# Patient Record
Sex: Female | Born: 1945 | Race: White | Hispanic: No | Marital: Married | Smoking: Never smoker
Health system: Southern US, Community
[De-identification: ages and names within clinical notes are randomized; demographics above are authoritative.]

## PROBLEM LIST (undated history)

## (undated) DIAGNOSIS — E785 Hyperlipidemia, unspecified: Secondary | ICD-10-CM

## (undated) DIAGNOSIS — E039 Hypothyroidism, unspecified: Secondary | ICD-10-CM

## (undated) DIAGNOSIS — I10 Essential (primary) hypertension: Secondary | ICD-10-CM

## (undated) DIAGNOSIS — M1990 Unspecified osteoarthritis, unspecified site: Secondary | ICD-10-CM

## (undated) DIAGNOSIS — N390 Urinary tract infection, site not specified: Secondary | ICD-10-CM

## (undated) DIAGNOSIS — L309 Dermatitis, unspecified: Secondary | ICD-10-CM

## (undated) HISTORY — PX: TUBAL LIGATION: SHX77

## (undated) HISTORY — DX: Dermatitis, unspecified: L30.9

## (undated) HISTORY — DX: Essential (primary) hypertension: I10

## (undated) HISTORY — DX: Hypothyroidism, unspecified: E03.9

## (undated) HISTORY — DX: Hyperlipidemia, unspecified: E78.5

## (undated) HISTORY — PX: FRACTURE SURGERY: SHX138

## (undated) HISTORY — PX: TONSILLECTOMY: SUR1361

---

## 2009-08-29 ENCOUNTER — Ambulatory Visit: Payer: Self-pay | Admitting: Cardiovascular Disease

## 2009-09-12 ENCOUNTER — Ambulatory Visit: Payer: Self-pay | Admitting: Cardiovascular Disease

## 2009-09-27 HISTORY — PX: FINGER GANGLION CYST EXCISION: SHX1636

## 2010-07-22 ENCOUNTER — Ambulatory Visit (HOSPITAL_BASED_OUTPATIENT_CLINIC_OR_DEPARTMENT_OTHER): Admission: RE | Admit: 2010-07-22 | Discharge: 2010-07-22 | Payer: Self-pay | Admitting: Plastic Surgery

## 2010-08-19 ENCOUNTER — Telehealth (INDEPENDENT_AMBULATORY_CARE_PROVIDER_SITE_OTHER): Payer: Self-pay | Admitting: *Deleted

## 2010-09-27 HISTORY — PX: UTERINE FIBROID SURGERY: SHX826

## 2010-10-27 NOTE — Progress Notes (Signed)
  Phone Note Other Incoming   Request: Send information Summary of Call: Request received from [employer] forwarded to [employer].       

## 2010-12-09 LAB — POCT I-STAT, CHEM 8
Calcium, Ion: 0.86 mmol/L — ABNORMAL LOW (ref 1.12–1.32)
Chloride: 112 mEq/L (ref 96–112)
Creatinine, Ser: 1.1 mg/dL (ref 0.4–1.2)
Glucose, Bld: 90 mg/dL (ref 70–99)
HCT: 45 % (ref 36.0–46.0)

## 2011-02-09 NOTE — Letter (Signed)
August 29, 2009    Dr. Lucila Maine  [address]  [place], [place]  [address]   RE:  [name], [name]  MRN:  [id number]  /  DOB:  02/08/1946   Dear Dr. Lorin Picket:   I had the pleasure of seeing your patient, Julia Hess in the  Cardiology Clinic today.  As you know, she is a 65 year old white female  with hypertension and hyperlipidemia who was found to have Q-waves in  the septal leads on her EKG.  The patient has excellent functional  status and is able to perform aerobic exercise for 30-60 minutes several  times a week without any difficulty whatsoever.  This is very  encouraging.  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  We will proceed with a  transthoracic echocardiogram to evaluate the patient's left ventricular  systolic function.  If the patient's ejection fraction is indeed  decreased, we will proceed with left heart catheterization.  Otherwise,  we will perform a stress test.  I hope you are in agreement with this  approach.   Thank for the referral of this patient and please feel free to contact  my office if I can be of further assistance.    Sincerely,      [name], MD  Electronically Signed    SGA/MedQ  DD: 08/29/2009  DT: 08/29/2009  Job #: [id number]

## 2011-02-09 NOTE — Assessment & Plan Note (Signed)
[facility]                        [place] CARDIOLOGY OFFICE NOTE   Julia Hess, Julia Hess                        MRN:          [id number]  DATE:09/12/2009                            DOB:          08-Oct-1945    PROBLEM LIST:  1. Hyperlipidemia.  2. Hypothyroidism.  3. EKG indicating a possible old anterior septal myocardial      infarction.   INTERVAL HISTORY:  The patient states she has been doing well as to her  last visit.  She continues to exercise 2-3 times a week for at least 30  minutes each time without any difficulty, whatsoever.  Specifically, she  denies any chest discomfort, shortness of breath or dyspnea on exertion.  The patient underwent a transthoracic echocardiogram which showed  completely normal left ventricular systolic function and no significant  valvular regurgitation.  She has subsequently underwent an exercise  Cardiolite, on which she performed 11 METS activity without any chest  discomfort.  There are no perfusion defects seen on the imaging scan.   PHYSICAL EXAMINATION:  VITAL SIGNS:  Today, her blood pressure is  127/78, pulse is 60, sating 98% on room air.  She weighs 130 pounds.  GENERAL:  She is in no acute distress.  HEENT:  Normocephalic, atraumatic.  NECK:  Supple.  There are no carotid bruits.  HEART:  Regular rate and rhythm without murmur, rub or gallop.  LUNGS:  Clear bilaterally.  ABDOMEN:  Soft, nontender, nondistended.  EXTREMITIES:  Without edema.  SKIN:  Warm and dry.   EKG from today independently interpreted by myself demonstrates normal  sinus rhythm.  She does have delayed R-wave progression and there are  possible Q-waves in leads V1 through V3.  However, upon very close  inspection it appears that there may be a slight R-waves in these leads.   Review of the patient's echocardiogram stress test as above in the  interval history.  Review of the patient's labs show a CBC within normal  limits, a  CMP within normal limits.  Her total cholesterol was 205, HDL  71, triglycerides 153, and her LDL is 103.  Her troponin was less than  0.31 and her vitamin D level was 56.  These labs are from December 6.   ASSESSMENT AND PLAN:  A 65 year old white female with hyperlipidemia  that is well treated, who is not having any signs of angina, heart  failure or arrhythmia.  Her cardiovascular workup for obstructive  coronary artery disease has been completely within normal limits.  At  this point, I think unlikely that the patient has had a myocardial  infarction.  I think that there are small R-waves present on the EKG and  that the findings in the anterior septal leads probably do not represent  an old myocardial infarction.  I recommend that she continue on a baby  aspirin and statin therapy.  She is currently also on a beta-blocker and  she will follow up with her primary care physician regarding appropriate  antihypertensive therapy.  We recommend that she increase her physical  activity to 5 or 6 days  a week, 30-60 minutes each day.  She will  contact our office in the future on an as-needed basis.  Otherwise, she  will follow up with Dr. Lorin Picket.      [name], MD  Electronically Signed    SGA/MedQ  DD: 09/12/2009  DT: 09/13/2009  Job #: [id number]   cc:   Mervin Kung

## 2011-02-09 NOTE — Assessment & Plan Note (Signed)
[facility]                        [place] CARDIOLOGY OFFICE NOTE   Julia Hess, Julia Hess                        MRN:          [id number]  DATE:08/29/2009                            DOB:          14-Mar-1946    CHIEF COMPLAINT:  Abnormal EKG.   HISTORY OF PRESENT ILLNESS:  Julia Hess is a 65 year old white female,  past medical history significant for hypertension, hyperlipidemia,  hypothyroidism, who is presenting after her primary care physician found  Q-waves in the septal leads on her EKG.  The patient states she is very  active in her daily life.  She performs aerobic exercise approximately 3  times a week and lasting 30-60 minutes without any difficulty  whatsoever.  Specifically, she denies any chest discomfort, shortness of  breath that is above what one would expect with aerobic exercise.  She  also denies any dizziness, syncope, lower extremity edema, PND,  orthopnea.  The patient does state that approximately 6 weeks ago, she  had 1 episode of a squeezing like feeling in her chest that lasted  approximately 20 minutes and did not have any associated symptoms.  The  chest pain resolved on its own and has not reoccurred since then.  She  saw her primary care physician last week and noticed Q-waves in leads V1  and V2 and a referral to Cardiology was made.   PAST MEDICAL HISTORY:  1. Hypertension.  2. Hypothyroidism.  3. Hyperlipidemia.   SOCIAL HISTORY:  No tobacco, no alcohol.   FAMILY HISTORY:  Negative for premature coronary artery disease,  although her father did have an MI in his late 87s.   ALLERGIES:  COMPAZINE.   MEDICATIONS:  1. Aspirin 162 mg daily.  2. Plavix 75 mg daily (This was started 2 days ago by her PCP).  3. Crestor 10 mg daily.  4. Metoprolol succinate 50 mg daily (This was started 2 days ago by      PCP).  5. Synthroid 50 mcg daily.  6. Estrace p.r.n.   REVIEW OF SYSTEMS:  As in HPI.  All other systems were  reviewed and are  negative.   PHYSICAL EXAMINATION:  VITAL SIGNS:  The patient's blood pressure is  135/76, pulse 65, sating 98% on room air, and she weighs 131 pounds.  GENERAL:  No acute distress.  HEENT:  Normocephalic, atraumatic, nonfocal.  NECK:  Supple.  There is no JVD.  There are no carotid bruits.  HEART:  Regular rate and rhythm without murmur, rub, or gallop.  LUNGS:  Clear bilaterally.  ABDOMEN:  Soft, nontender, nondistended.  EXTREMITIES:  Without edema.  SKIN:  Warm and dry.  NEUROLOGIC:  Nonfocal.  PSYCHIATRIC:  The patient is mildly anxious, but appropriate with normal  levels of insight.  MUSCULOSKELETAL:  The patient has 5/5 bilateral upper and lower  extremity strength.  Pulses, the patient has 2+ bilateral carotid,  radial, and as she has palpable dorsalis pedis pulses bilaterally.   EKG independently reviewed by myself demonstrates a sinus bradycardia  with ventricular rate of 56 beats per minute.  There are Q-waves in  leads V1 through V3 with approximately 1.5-mm ST-segment elevation.  It  is stable when compared with EKG from primary care physician's office.  There is now also Q-wave in lead V3 which is likely the results of lead  placement.   ASSESSMENT:  A 65 year old white female with hypertension and  hyperlipidemia presenting with an abnormal EKG indicating she likely has  had a prior myocardial infarction.  The patient has excellent functional  status and is able to perform aerobic exercise for significant amount of  time without any difficulty whatsoever.  She is not having any symptoms  consistent with angina, heart failure, or arrhythmia.   PLAN:  We will initially check a transthoracic echocardiogram to  evaluate the patient's left ventricular systolic function.  If the  patient's ventricular systolic function is within normal limits, we will  proceed with a stress test to evaluate for any inducible ischemia.  If  the patient's ejection  fraction is decreased, we will proceed with a  left heart catheterization.  We will also check a CMP, CBC, vitamin D  level, and fasting lipid profile.  The patient is instructed that if she  were to develop any chest discomfort that does not quickly resolve, she  should call 911.  We will contact the patient once results for  echocardiogram are obtained early next week.     Julia El, MD  Electronically Signed    SGA/MedQ  DD: 08/29/2009  DT: 08/30/2009  Job #: [id number]

## 2011-05-19 ENCOUNTER — Encounter: Payer: Self-pay | Admitting: Cardiovascular Disease

## 2011-06-04 ENCOUNTER — Encounter: Payer: Self-pay | Admitting: Cardiovascular Disease

## 2012-07-07 ENCOUNTER — Encounter (HOSPITAL_BASED_OUTPATIENT_CLINIC_OR_DEPARTMENT_OTHER): Payer: Self-pay | Admitting: *Deleted

## 2012-07-07 ENCOUNTER — Other Ambulatory Visit: Payer: Self-pay | Admitting: Orthopedic Surgery

## 2012-07-07 NOTE — Progress Notes (Signed)
To come in for ekg-bmet 

## 2012-07-10 ENCOUNTER — Encounter (HOSPITAL_BASED_OUTPATIENT_CLINIC_OR_DEPARTMENT_OTHER)
Admission: RE | Admit: 2012-07-10 | Discharge: 2012-07-10 | Disposition: A | Discharge status: Home / Self Care | Payer: Medicare Other | Source: Ambulatory Visit | Attending: Orthopedic Surgery | Admitting: Orthopedic Surgery

## 2012-07-10 LAB — BASIC METABOLIC PANEL
BUN: 10 mg/dL (ref 6–23)
CO2: 28 mEq/L (ref 19–32)
Calcium: 9.2 mg/dL (ref 8.4–10.5)
Creatinine, Ser: 0.9 mg/dL (ref 0.50–1.10)
Glucose, Bld: 85 mg/dL (ref 70–99)

## 2012-07-12 ENCOUNTER — Ambulatory Visit (HOSPITAL_BASED_OUTPATIENT_CLINIC_OR_DEPARTMENT_OTHER)
Admission: RE | Admit: 2012-07-12 | Discharge: 2012-07-12 | Disposition: A | Discharge status: Home / Self Care | Payer: Medicare Other | Source: Ambulatory Visit | Attending: Orthopedic Surgery | Admitting: Orthopedic Surgery

## 2012-07-12 ENCOUNTER — Encounter (HOSPITAL_BASED_OUTPATIENT_CLINIC_OR_DEPARTMENT_OTHER): Payer: Self-pay | Admitting: *Deleted

## 2012-07-12 ENCOUNTER — Encounter (HOSPITAL_BASED_OUTPATIENT_CLINIC_OR_DEPARTMENT_OTHER)
Admission: RE | Disposition: A | Discharge status: Home / Self Care | Payer: Self-pay | Source: Ambulatory Visit | Attending: Orthopedic Surgery

## 2012-07-12 ENCOUNTER — Ambulatory Visit (HOSPITAL_BASED_OUTPATIENT_CLINIC_OR_DEPARTMENT_OTHER): Payer: Medicare Other | Admitting: Certified Registered Nurse Anesthetist

## 2012-07-12 ENCOUNTER — Encounter (HOSPITAL_BASED_OUTPATIENT_CLINIC_OR_DEPARTMENT_OTHER): Payer: Self-pay | Admitting: Certified Registered Nurse Anesthetist

## 2012-07-12 ENCOUNTER — Encounter (HOSPITAL_BASED_OUTPATIENT_CLINIC_OR_DEPARTMENT_OTHER): Payer: Self-pay | Admitting: Orthopedic Surgery

## 2012-07-12 DIAGNOSIS — M6740 Ganglion, unspecified site: Secondary | ICD-10-CM | POA: Insufficient documentation

## 2012-07-12 DIAGNOSIS — I10 Essential (primary) hypertension: Secondary | ICD-10-CM | POA: Insufficient documentation

## 2012-07-12 DIAGNOSIS — Z01812 Encounter for preprocedural laboratory examination: Secondary | ICD-10-CM | POA: Insufficient documentation

## 2012-07-12 DIAGNOSIS — Z01810 Encounter for preprocedural cardiovascular examination: Secondary | ICD-10-CM | POA: Insufficient documentation

## 2012-07-12 HISTORY — DX: Urinary tract infection, site not specified: N39.0

## 2012-07-12 HISTORY — DX: Unspecified osteoarthritis, unspecified site: M19.90

## 2012-07-12 HISTORY — PX: MASS EXCISION: SHX2000

## 2012-07-12 SURGERY — EXCISION MASS
Anesthesia: General | Laterality: Right

## 2012-07-12 MED ORDER — CEFAZOLIN SODIUM-DEXTROSE 2-3 GM-% IV SOLR
2.0000 g | INTRAVENOUS | Status: AC
  Administered 2012-07-12: 2 g via INTRAVENOUS

## 2012-07-12 MED ORDER — LIDOCAINE HCL (CARDIAC) 20 MG/ML IV SOLN
INTRAVENOUS | Status: DC | PRN
  Administered 2012-07-12: 50 mg via INTRAVENOUS

## 2012-07-12 MED ORDER — FENTANYL CITRATE 0.05 MG/ML IJ SOLN
INTRAMUSCULAR | Status: DC | PRN
  Administered 2012-07-12: 50 ug via INTRAVENOUS

## 2012-07-12 MED ORDER — HYDROCODONE-ACETAMINOPHEN 5-500 MG PO TABS: 1.0000 | ORAL_TABLET | ORAL | Status: AC | PRN

## 2012-07-12 MED ORDER — CHLORHEXIDINE GLUCONATE 4 % EX LIQD: 60.0000 mL | Freq: Once | CUTANEOUS | Status: DC

## 2012-07-12 MED ORDER — OXYCODONE HCL 5 MG/5ML PO SOLN: 5.0000 mg | Freq: Once | ORAL | Status: DC | PRN

## 2012-07-12 MED ORDER — HYDROMORPHONE HCL PF 1 MG/ML IJ SOLN: 0.2500 mg | INTRAMUSCULAR | Status: DC | PRN

## 2012-07-12 MED ORDER — ONDANSETRON HCL 4 MG/2ML IJ SOLN: 4.0000 mg | Freq: Once | INTRAMUSCULAR | Status: DC | PRN

## 2012-07-12 MED ORDER — LACTATED RINGERS IV SOLN
INTRAVENOUS | Status: DC
  Administered 2012-07-12: 10:00:00 via INTRAVENOUS

## 2012-07-12 MED ORDER — BUPIVACAINE HCL (PF) 0.25 % IJ SOLN
INTRAMUSCULAR | Status: DC | PRN
  Administered 2012-07-12: 10 mL

## 2012-07-12 MED ORDER — PROPOFOL 10 MG/ML IV BOLUS
INTRAVENOUS | Status: DC | PRN
  Administered 2012-07-12: 150 mg via INTRAVENOUS

## 2012-07-12 MED ORDER — MIDAZOLAM HCL 5 MG/5ML IJ SOLN
INTRAMUSCULAR | Status: DC | PRN
  Administered 2012-07-12: 2 mg via INTRAVENOUS

## 2012-07-12 MED ORDER — DEXAMETHASONE SODIUM PHOSPHATE 10 MG/ML IJ SOLN
INTRAMUSCULAR | Status: DC | PRN
  Administered 2012-07-12: 10 mg via INTRAVENOUS

## 2012-07-12 MED ORDER — ONDANSETRON HCL 4 MG/2ML IJ SOLN
INTRAMUSCULAR | Status: DC | PRN
  Administered 2012-07-12: 4 mg via INTRAVENOUS

## 2012-07-12 MED ORDER — OXYCODONE HCL 5 MG PO TABS: 5.0000 mg | ORAL_TABLET | Freq: Once | ORAL | Status: DC | PRN

## 2012-07-12 SURGICAL SUPPLY — 52 items
BANDAGE COBAN STERILE 2 (GAUZE/BANDAGES/DRESSINGS) IMPLANT
BANDAGE GAUZE ELAST BULKY 4 IN (GAUZE/BANDAGES/DRESSINGS) IMPLANT
BLADE MINI RND TIP GREEN BEAV (BLADE) ×2 IMPLANT
BLADE SURG 15 STRL LF DISP TIS (BLADE) ×1 IMPLANT
BLADE SURG 15 STRL SS (BLADE) ×1
BNDG COHESIVE 1X5 TAN STRL LF (GAUZE/BANDAGES/DRESSINGS) ×2 IMPLANT
BNDG COHESIVE 3X5 TAN STRL LF (GAUZE/BANDAGES/DRESSINGS) IMPLANT
BNDG ESMARK 4X9 LF (GAUZE/BANDAGES/DRESSINGS) ×2 IMPLANT
CHLORAPREP W/TINT 26ML (MISCELLANEOUS) ×2 IMPLANT
CLOTH BEACON ORANGE TIMEOUT ST (SAFETY) ×2 IMPLANT
CORDS BIPOLAR (ELECTRODE) ×2 IMPLANT
COVER MAYO STAND STRL (DRAPES) ×2 IMPLANT
COVER TABLE BACK 60X90 (DRAPES) ×2 IMPLANT
CUFF TOURNIQUET SINGLE 18IN (TOURNIQUET CUFF) IMPLANT
DECANTER SPIKE VIAL GLASS SM (MISCELLANEOUS) IMPLANT
DRAIN PENROSE 1/2X12 LTX STRL (WOUND CARE) IMPLANT
DRAPE EXTREMITY T 121X128X90 (DRAPE) ×2 IMPLANT
DRAPE SURG 17X23 STRL (DRAPES) ×2 IMPLANT
GAUZE XEROFORM 1X8 LF (GAUZE/BANDAGES/DRESSINGS) ×2 IMPLANT
GLOVE BIO SURGEON STRL SZ 6.5 (GLOVE) ×2 IMPLANT
GLOVE BIOGEL PI IND STRL 7.0 (GLOVE) ×1 IMPLANT
GLOVE BIOGEL PI IND STRL 8.5 (GLOVE) ×1 IMPLANT
GLOVE BIOGEL PI INDICATOR 7.0 (GLOVE) ×1
GLOVE BIOGEL PI INDICATOR 8.5 (GLOVE) ×1
GLOVE SURG ORTHO 8.0 STRL STRW (GLOVE) ×2 IMPLANT
GOWN BRE IMP PREV XXLGXLNG (GOWN DISPOSABLE) ×2 IMPLANT
GOWN PREVENTION PLUS XLARGE (GOWN DISPOSABLE) ×2 IMPLANT
NDL SAFETY ECLIPSE 18X1.5 (NEEDLE) IMPLANT
NEEDLE 27GAX1X1/2 (NEEDLE) ×2 IMPLANT
NEEDLE HYPO 18GX1.5 SHARP (NEEDLE)
NS IRRIG 1000ML POUR BTL (IV SOLUTION) ×2 IMPLANT
PACK BASIN DAY SURGERY FS (CUSTOM PROCEDURE TRAY) ×2 IMPLANT
PAD CAST 3X4 CTTN HI CHSV (CAST SUPPLIES) IMPLANT
PADDING CAST ABS 3INX4YD NS (CAST SUPPLIES)
PADDING CAST ABS 4INX4YD NS (CAST SUPPLIES)
PADDING CAST ABS COTTON 3X4 (CAST SUPPLIES) IMPLANT
PADDING CAST ABS COTTON 4X4 ST (CAST SUPPLIES) IMPLANT
PADDING CAST COTTON 3X4 STRL (CAST SUPPLIES)
SPLINT FINGER 5/8X3.25 (SOFTGOODS) ×2 IMPLANT
SPLINT FINGER FOAM 3 9119 05 (SOFTGOODS) ×4
SPLINT PLASTER CAST XFAST 3X15 (CAST SUPPLIES) IMPLANT
SPLINT PLASTER XTRA FASTSET 3X (CAST SUPPLIES)
SPONGE GAUZE 4X4 12PLY (GAUZE/BANDAGES/DRESSINGS) ×2 IMPLANT
STOCKINETTE 4X48 STRL (DRAPES) ×2 IMPLANT
SUT VIC AB 4-0 P2 18 (SUTURE) IMPLANT
SUT VICRYL RAPID 5 0 P 3 (SUTURE) IMPLANT
SUT VICRYL RAPIDE 4/0 PS 2 (SUTURE) ×2 IMPLANT
SYR BULB 3OZ (MISCELLANEOUS) ×2 IMPLANT
SYR CONTROL 10ML LL (SYRINGE) ×2 IMPLANT
TOWEL OR 17X24 6PK STRL BLUE (TOWEL DISPOSABLE) ×2 IMPLANT
UNDERPAD 30X30 INCONTINENT (UNDERPADS AND DIAPERS) ×2 IMPLANT
WATER STERILE IRR 1000ML POUR (IV SOLUTION) IMPLANT

## 2012-07-12 NOTE — Anesthesia Preprocedure Evaluation (Signed)
Anesthesia Evaluation  Patient identified by MRN, date of birth, ID band Patient awake    Reviewed: Allergy & Precautions, H&P , NPO status , Patient's Chart, lab work & pertinent test results  Airway Mallampati: I TM Distance: >3 FB Neck ROM: Full    Dental  (+) Teeth Intact   Pulmonary  breath sounds clear to auscultation        Cardiovascular hypertension, Pt. on medications Rhythm:Regular Rate:Normal     Neuro/Psych    GI/Hepatic   Endo/Other    Renal/GU      Musculoskeletal   Abdominal   Peds  Hematology   Anesthesia Other Findings   Reproductive/Obstetrics                           Anesthesia Physical Anesthesia Plan  ASA: III  Anesthesia Plan: General   Post-op Pain Management:    Induction: Intravenous  Airway Management Planned: LMA  Additional Equipment:   Intra-op Plan:   Post-operative Plan: Extubation in OR  Informed Consent: I have reviewed the patients History and Physical, chart, labs and discussed the procedure including the risks, benefits and alternatives for the proposed anesthesia with the patient or authorized representative who has indicated his/her understanding and acceptance.   Dental advisory given  Plan Discussed with: Anesthesiologist, CRNA and Surgeon  Anesthesia Plan Comments:         Anesthesia Quick Evaluation

## 2012-07-12 NOTE — H&P (Signed)
Julia Hess is a 66 year old right hand dominant female who comes in complaining of a mass on the dorsal aspect of her right index finger for the past 6 months to a year. She recalls no history of injury to it. It is not particularly painful for her. She states that 2 weeks ago it opened up and drained. She has had a mucoid cyst removed from her left hand in the past. She has no history of diabetes. She a history of thyroid problems, arthritis and gout. Her cyst was removed from her left middle finger and has not returned.  PAST MEDICAL HISTORY: She is on the following medications: Calcium, cranberry, Crestor, Desonide, Estrace, Losartan potassium, Nitrofurantoin, Synthroid, and vitamin D. She is allergic to Compazine. She has had T&A, tubal ligation, broken ankle, cyst removed from her finger in 2011, and thyroidectomy.  FAMILY H ISTORY: Positive for high BP, heart disease and arthritis.  SOCIAL HISTORY: She does not smoke or drink. She is married and a Veterinary surgeon.  REVIEW OF SYSTEMS: Positive cataracts, ringing in her ears, high BP, heart attack, easy bleeding, otherwise normal. Julia Hess is an 66 y.o. female.   Chief Complaint: mucoid tumors RIF, RRF HPI: see above  Past Medical History  Diagnosis Date  . HTN (hypertension)   . HLD (hyperlipidemia)   . Hypothyroidism   . Frequent UTI   . Arthritis     Past Surgical History  Procedure Date  . Uterine fibroid surgery 2012  . Tonsillectomy   . Tubal ligation   . Finger ganglion cyst excision 2011  . Fracture surgery     lt ankle    Family History  Problem Relation Age of Onset  . Heart attack     Social History:  reports that she has never smoked. She does not have any smokeless tobacco history on file. She reports that she drinks alcohol. She reports that she does not use illicit drugs.  Allergies:  Allergies  Allergen Reactions  . Compazine     Medications Prior to Admission  Medication Sig Dispense Refill  .  aspirin 81 MG tablet Take 81 mg by mouth daily.        Marland Kitchen estradiol (ESTRACE) 0.1 MG/GM vaginal cream Place 2 g vaginally as needed.        Marland Kitchen levothyroxine (SYNTHROID, LEVOTHROID) 50 MCG tablet Take 50 mcg by mouth daily.        Marland Kitchen losartan (COZAAR) 50 MG tablet Take 50 mg by mouth daily.      . nitrofurantoin, macrocrystal-monohydrate, (MACROBID) 100 MG capsule Take 100 mg by mouth 2 (two) times daily. As needed for UTI       . rosuvastatin (CRESTOR) 10 MG tablet Take 10 mg by mouth daily.          No results found for this or any previous visit (from the past 48 hour(s)).  No results found.   Pertinent items are noted in HPI.  Blood pressure 120/79, pulse 73, temperature 98.2 F (36.8 C), temperature source Oral, resp. rate 18, height 5\' 2"  (1.575 m), weight 57.38 kg (126 lb 8 oz), SpO2 100.00%.  General appearance: alert, cooperative and appears stated age Head: Normocephalic, without obvious abnormality Neck: no adenopathy Resp: clear to auscultation bilaterally Cardio: regular rate and rhythm, S1, S2 normal, no murmur, click, rub or gallop GI: soft, non-tender; bowel sounds normal; no masses,  no organomegaly Extremities: extremities normal, atraumatic, no cyanosis or edema Pulses: 2+ and symmetric Skin: Skin color, texture,  turgor normal. No rashes or lesions Neurologic: Grossly normal Incision/Wound: na  Assessment/Plan X-rays of the index finger reveals degenerative arthritis.  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  We would recommend surgical excision with debridement of the joint. This can be done at the same time. The pre, peri and post op course are discussed along with risks and complications.  She is aware there is no guarantee with surgery, possibility of infection, recurrence, injury to arteries, nerves and tendons, incomplete relief of symptoms, recurrence of symptoms, debridement of joint, possibility of fusion to be  certain that another one does not occur and dystrophy. We would not recommend at this time.   Julia Hess R 07/12/2012, 11:22 AM

## 2012-07-12 NOTE — Brief Op Note (Signed)
07/12/2012  12:28 PM  PATIENT:  Julia Hess  66 y.o. 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  POST-OPERATIVE DIAGNOSIS:  Mucoid Cyst Right Index Finger and Right Ring Finger  PROCEDURE:  Procedure(s) (LRB) with comments: EXCISION MASS (Right) - Excision Cyst Right Index and Right Ring Finger and Debridement DIP Right Index Finger and Right Ring Finger  SURGEON:  Surgeon(s) and Role:    * Nicki Reaper, MD - Primary  PHYSICIAN ASSISTANT:   ASSISTANTS: none   ANESTHESIA:   local and general  EBL:     BLOOD ADMINISTERED:none  DRAINS: none   LOCAL MEDICATIONS USED:  MARCAINE     SPECIMEN:  Excision  DISPOSITION OF SPECIMEN:  PATHOLOGY  COUNTS:  YES  TOURNIQUET:   Total Tourniquet Time Documented: Upper Arm (Right) - 25 minutes  DICTATION: .Other Dictation: Dictation Number [id number]  PLAN OF CARE: Discharge to home after PACU  PATIENT DISPOSITION:  PACU - hemodynamically stable.

## 2012-07-12 NOTE — Transfer of Care (Signed)
Immediate Anesthesia Transfer of Care Note  Patient: Julia Hess  Procedure(s) Performed: Procedure(s) (LRB) with comments: EXCISION MASS (Right) - Excision Cyst Right Index and Right Ring Finger and Debridement DIP Right Index Finger and Right Ring Finger  Patient Location: PACU  Anesthesia Type: General  Level of Consciousness: sedated and patient cooperative  Airway & Oxygen Therapy: Patient Spontanous Breathing and Patient connected to face mask oxygen  Post-op Assessment: Report given to PACU RN and Post -op Vital signs reviewed and stable  Post vital signs: Reviewed and stable  Complications: No apparent anesthesia complications

## 2012-07-12 NOTE — Op Note (Signed)
Dictation Number [id number]

## 2012-07-12 NOTE — Anesthesia Procedure Notes (Signed)
Procedure Name: LMA Insertion Date/Time: 07/12/2012 11:49 AM Performed by: Gar Gibbon Pre-anesthesia Checklist: Patient identified, Emergency Drugs available, Suction available and Patient being monitored Patient Re-evaluated:Patient Re-evaluated prior to inductionOxygen Delivery Method: Circle System Utilized Preoxygenation: Pre-oxygenation with 100% oxygen Intubation Type: IV induction Ventilation: Mask ventilation without difficulty LMA: LMA inserted LMA Size: 4.0 Number of attempts: 1 Airway Equipment and Method: bite block Placement Confirmation: positive ETCO2 Tube secured with: Tape Dental Injury: Teeth and Oropharynx as per pre-operative assessment

## 2012-07-12 NOTE — Anesthesia Postprocedure Evaluation (Signed)
  Anesthesia Post-op Note  Patient: Julia Hess  Procedure(s) Performed: Procedure(s) (LRB) with comments: EXCISION MASS (Right) - Excision Cyst Right Index and Right Ring Finger and Debridement DIP Right Index Finger and Right Ring Finger  Patient Location: PACU  Anesthesia Type: General  Level of Consciousness: awake, alert  and oriented  Airway and Oxygen Therapy: Patient Spontanous Breathing  Post-op Pain: mild  Post-op Assessment: Post-op Vital signs reviewed  Post-op Vital Signs: Reviewed  Complications: No apparent anesthesia complications

## 2012-07-13 ENCOUNTER — Encounter (HOSPITAL_BASED_OUTPATIENT_CLINIC_OR_DEPARTMENT_OTHER): Payer: Self-pay | Admitting: Orthopedic Surgery

## 2012-07-13 NOTE — Op Note (Signed)
Julia Hess, Julia Hess NO.:  [id number]  MEDICAL RECORD NO.:  [id number]  LOCATION:                                 FACILITY:  PHYSICIAN:  Cindee Salt, M.D.            DATE OF BIRTH:  DATE OF PROCEDURE:  07/12/2012 DATE OF DISCHARGE:                              OPERATIVE REPORT   PREOPERATIVE DIAGNOSIS:  Mucoid tumors, right index, right ring finger.  POSTOPERATIVE DIAGNOSIS:  Mucoid tumors, right index, right ring finger.  OPERATION:  Excision of mucoid cyst; debridement of distal interphalangeal joint, right index finger, right ring finger.  SURGEON:  Cindee Salt, MD  ANESTHESIA:  General with metacarpal block.  ANESTHESIOLOGIST:  Sheldon Silvan, MD  HISTORY:  The patient is a 66 year old female with a history of mucoid cyst of her index and ring fingers, right hand.  She is elected to undergo surgical excision, debridement of the joint on each finger. Pre, peri and postoperative course have been discussed along with risks and complications.  She is aware that there is no guarantee with the surgery; possibility of infection; recurrence of injury to arteries, nerves, tendons; incomplete relief of symptoms; dystrophy; possibility of recurrence as well as there is degenerative joint present that the only way to guarantee non-recurrences with a fusion.  She is not desires proceeding to have that done.  In the preoperative area, the patient is seen, the extremity marked by both the patient and surgeon, and antibiotic given.  PROCEDURE:  The patient was brought to the operating room where a general anesthetic was carried out without difficulty under the direction of Dr. Ivin Booty.  She was prepped using ChloraPrep, supine position, right arm free.  A 3-minute dry time was allowed.  Time-out taken, confirming the patient and procedure.  The limb was exsanguinated with an Esmarch bandage.  Tourniquet placed on the upper arm was inflated to 250 mmHg.  The curvilinear  incision was made over the ring finger first, carried down through the subcutaneous tissue.  Three cysts were immediately encountered.  With blunt and sharp dissection, these were resected, sent to Pathology.  The joint was opened.  A debridement was then performed on osteophytes both radially and ulnarly.  The joint was copiously irrigated with saline.  A synovectomy was performed.  The skin was then closed with interrupted 4-0 Vicryl Rapide sutures. Separate incision was then made on the index finger.  Again, a large cyst was present distally, this was multiloculated under the skin, this was isolated with a curette and the most volar aspect removed leaving the dorsal aspect intact without penetrating the skin.  The joint was then opened.  A debridement was performed.  Osteophytes were removed both radially and ulnarly from the middle phalanx.  The specimen was sent to Pathology.  The joint was irrigated after synovectomy performed, and the skin was closed with interrupted 5-0 Vicryl Rapide sutures.  A metacarpal block was then given to each finger using 0.25% Marcaine without epinephrine, 10 mL total was used. A sterile compressive dressing to each finger along with a splint to the distal interphalangeal joint applied.  On deflation of the tourniquet,  remaining fingers were pinked.  She was taken to the recovery room for observation in satisfactory condition.  She will be discharged to home 1 week, on Vicodin.          ______________________________ Cindee Salt, M.D.     GK/MEDQ  D:  07/12/2012  T:  07/13/2012  Job:  [id number]

## 2012-10-06 ENCOUNTER — Encounter (HOSPITAL_BASED_OUTPATIENT_CLINIC_OR_DEPARTMENT_OTHER): Payer: Self-pay | Admitting: *Deleted

## 2012-10-06 NOTE — Progress Notes (Signed)
Pt was here 10/13 for this finger-cyst grew back Does not want general anesth Will need istat

## 2012-10-09 ENCOUNTER — Encounter (HOSPITAL_BASED_OUTPATIENT_CLINIC_OR_DEPARTMENT_OTHER): Payer: Self-pay | Admitting: Certified Registered"

## 2012-10-09 ENCOUNTER — Ambulatory Visit (HOSPITAL_BASED_OUTPATIENT_CLINIC_OR_DEPARTMENT_OTHER)
Admission: RE | Admit: 2012-10-09 | Discharge: 2012-10-09 | Disposition: A | Discharge status: Home / Self Care | Payer: Medicare Other | Source: Ambulatory Visit | Attending: Orthopedic Surgery | Admitting: Orthopedic Surgery

## 2012-10-09 ENCOUNTER — Encounter (HOSPITAL_BASED_OUTPATIENT_CLINIC_OR_DEPARTMENT_OTHER): Payer: Self-pay | Admitting: *Deleted

## 2012-10-09 ENCOUNTER — Ambulatory Visit (HOSPITAL_BASED_OUTPATIENT_CLINIC_OR_DEPARTMENT_OTHER): Payer: Medicare Other | Admitting: Certified Registered"

## 2012-10-09 ENCOUNTER — Encounter (HOSPITAL_BASED_OUTPATIENT_CLINIC_OR_DEPARTMENT_OTHER): Payer: Self-pay | Admitting: Orthopedic Surgery

## 2012-10-09 ENCOUNTER — Other Ambulatory Visit: Payer: Self-pay | Admitting: Orthopedic Surgery

## 2012-10-09 ENCOUNTER — Encounter (HOSPITAL_BASED_OUTPATIENT_CLINIC_OR_DEPARTMENT_OTHER)
Admission: RE | Disposition: A | Discharge status: Home / Self Care | Payer: Self-pay | Source: Ambulatory Visit | Attending: Orthopedic Surgery

## 2012-10-09 DIAGNOSIS — E039 Hypothyroidism, unspecified: Secondary | ICD-10-CM | POA: Insufficient documentation

## 2012-10-09 DIAGNOSIS — L723 Sebaceous cyst: Secondary | ICD-10-CM | POA: Insufficient documentation

## 2012-10-09 DIAGNOSIS — E785 Hyperlipidemia, unspecified: Secondary | ICD-10-CM | POA: Insufficient documentation

## 2012-10-09 DIAGNOSIS — Z79899 Other long term (current) drug therapy: Secondary | ICD-10-CM | POA: Insufficient documentation

## 2012-10-09 DIAGNOSIS — I10 Essential (primary) hypertension: Secondary | ICD-10-CM | POA: Insufficient documentation

## 2012-10-09 DIAGNOSIS — Z7982 Long term (current) use of aspirin: Secondary | ICD-10-CM | POA: Insufficient documentation

## 2012-10-09 SURGERY — EXCISION METACARPAL MASS
Anesthesia: Monitor Anesthesia Care | Site: Finger | Laterality: Right | Wound class: Clean

## 2012-10-09 MED ORDER — MIDAZOLAM HCL 5 MG/5ML IJ SOLN
INTRAMUSCULAR | Status: DC | PRN
  Administered 2012-10-09: 1 mg via INTRAVENOUS

## 2012-10-09 MED ORDER — HYDROCODONE-ACETAMINOPHEN 5-325 MG PO TABS: 1.0000 | ORAL_TABLET | Freq: Four times a day (QID) | ORAL | Status: DC | PRN

## 2012-10-09 MED ORDER — OXYCODONE HCL 5 MG PO TABS: 5.0000 mg | ORAL_TABLET | Freq: Once | ORAL | Status: DC | PRN

## 2012-10-09 MED ORDER — DEXAMETHASONE SODIUM PHOSPHATE 4 MG/ML IJ SOLN
INTRAMUSCULAR | Status: DC | PRN
  Administered 2012-10-09: 10 mg via INTRAVENOUS

## 2012-10-09 MED ORDER — FENTANYL CITRATE 0.05 MG/ML IJ SOLN: 25.0000 ug | INTRAMUSCULAR | Status: DC | PRN

## 2012-10-09 MED ORDER — CHLORHEXIDINE GLUCONATE 4 % EX LIQD: 60.0000 mL | Freq: Once | CUTANEOUS | Status: DC

## 2012-10-09 MED ORDER — PROMETHAZINE HCL 25 MG/ML IJ SOLN: 6.2500 mg | INTRAMUSCULAR | Status: DC | PRN

## 2012-10-09 MED ORDER — LIDOCAINE HCL (CARDIAC) 20 MG/ML IV SOLN
INTRAVENOUS | Status: DC | PRN
  Administered 2012-10-09: 40 mg via INTRAVENOUS

## 2012-10-09 MED ORDER — ONDANSETRON HCL 4 MG/2ML IJ SOLN
INTRAMUSCULAR | Status: DC | PRN
  Administered 2012-10-09: 4 mg via INTRAVENOUS

## 2012-10-09 MED ORDER — EPHEDRINE SULFATE 50 MG/ML IJ SOLN
INTRAMUSCULAR | Status: DC | PRN
  Administered 2012-10-09: 15 mg via INTRAVENOUS

## 2012-10-09 MED ORDER — MEPERIDINE HCL 25 MG/ML IJ SOLN: 6.2500 mg | INTRAMUSCULAR | Status: DC | PRN

## 2012-10-09 MED ORDER — MIDAZOLAM HCL 2 MG/2ML IJ SOLN: 0.5000 mg | Freq: Once | INTRAMUSCULAR | Status: DC | PRN

## 2012-10-09 MED ORDER — PROPOFOL 10 MG/ML IV BOLUS
INTRAVENOUS | Status: DC | PRN
  Administered 2012-10-09: 150 mg via INTRAVENOUS

## 2012-10-09 MED ORDER — BUPIVACAINE HCL (PF) 0.25 % IJ SOLN
INTRAMUSCULAR | Status: DC | PRN
  Administered 2012-10-09: 6.5 mL

## 2012-10-09 MED ORDER — LACTATED RINGERS IV SOLN
INTRAVENOUS | Status: DC
  Administered 2012-10-09: 13:00:00 via INTRAVENOUS

## 2012-10-09 MED ORDER — FENTANYL CITRATE 0.05 MG/ML IJ SOLN
INTRAMUSCULAR | Status: DC | PRN
  Administered 2012-10-09: 50 ug via INTRAVENOUS

## 2012-10-09 MED ORDER — OXYCODONE HCL 5 MG/5ML PO SOLN: 5.0000 mg | Freq: Once | ORAL | Status: DC | PRN

## 2012-10-09 MED ORDER — CEFAZOLIN SODIUM-DEXTROSE 2-3 GM-% IV SOLR: 2.0000 g | INTRAVENOUS | Status: DC

## 2012-10-09 MED ORDER — CEFAZOLIN SODIUM-DEXTROSE 2-3 GM-% IV SOLR
INTRAVENOUS | Status: DC | PRN
  Administered 2012-10-09: 2 g via INTRAVENOUS

## 2012-10-09 SURGICAL SUPPLY — 53 items
BANDAGE COBAN STERILE 2 (GAUZE/BANDAGES/DRESSINGS) IMPLANT
BANDAGE GAUZE ELAST BULKY 4 IN (GAUZE/BANDAGES/DRESSINGS) IMPLANT
BLADE MINI RND TIP GREEN BEAV (BLADE) ×2 IMPLANT
BLADE SURG 15 STRL LF DISP TIS (BLADE) ×1 IMPLANT
BLADE SURG 15 STRL SS (BLADE) ×1
BNDG COHESIVE 1X5 TAN STRL LF (GAUZE/BANDAGES/DRESSINGS) ×2 IMPLANT
BNDG COHESIVE 3X5 TAN STRL LF (GAUZE/BANDAGES/DRESSINGS) IMPLANT
BNDG ESMARK 4X9 LF (GAUZE/BANDAGES/DRESSINGS) ×2 IMPLANT
CHLORAPREP W/TINT 26ML (MISCELLANEOUS) ×2 IMPLANT
CLOTH BEACON ORANGE TIMEOUT ST (SAFETY) ×2 IMPLANT
CORDS BIPOLAR (ELECTRODE) ×2 IMPLANT
COVER MAYO STAND STRL (DRAPES) ×2 IMPLANT
COVER TABLE BACK 60X90 (DRAPES) ×2 IMPLANT
CUFF TOURNIQUET SINGLE 18IN (TOURNIQUET CUFF) ×2 IMPLANT
DECANTER SPIKE VIAL GLASS SM (MISCELLANEOUS) IMPLANT
DRAIN PENROSE 1/2X12 LTX STRL (WOUND CARE) IMPLANT
DRAPE EXTREMITY T 121X128X90 (DRAPE) ×2 IMPLANT
DRAPE SURG 17X23 STRL (DRAPES) ×2 IMPLANT
GAUZE XEROFORM 1X8 LF (GAUZE/BANDAGES/DRESSINGS) ×2 IMPLANT
GLOVE BIO SURGEON STRL SZ 6.5 (GLOVE) ×4 IMPLANT
GLOVE BIOGEL PI IND STRL 8.5 (GLOVE) ×1 IMPLANT
GLOVE BIOGEL PI INDICATOR 8.5 (GLOVE) ×1
GLOVE INDICATOR 7.0 STRL GRN (GLOVE) ×2 IMPLANT
GLOVE SKINSENSE NS SZ7.0 (GLOVE) ×1
GLOVE SKINSENSE STRL SZ7.0 (GLOVE) ×1 IMPLANT
GLOVE SURG ORTHO 8.0 STRL STRW (GLOVE) ×2 IMPLANT
GOWN BRE IMP PREV XXLGXLNG (GOWN DISPOSABLE) ×2 IMPLANT
GOWN PREVENTION PLUS XLARGE (GOWN DISPOSABLE) ×6 IMPLANT
NDL SAFETY ECLIPSE 18X1.5 (NEEDLE) ×1 IMPLANT
NEEDLE 27GAX1X1/2 (NEEDLE) ×2 IMPLANT
NEEDLE HYPO 18GX1.5 SHARP (NEEDLE) ×1
NS IRRIG 1000ML POUR BTL (IV SOLUTION) ×2 IMPLANT
PACK BASIN DAY SURGERY FS (CUSTOM PROCEDURE TRAY) ×2 IMPLANT
PAD CAST 3X4 CTTN HI CHSV (CAST SUPPLIES) IMPLANT
PADDING CAST ABS 3INX4YD NS (CAST SUPPLIES)
PADDING CAST ABS 4INX4YD NS (CAST SUPPLIES) ×1
PADDING CAST ABS COTTON 3X4 (CAST SUPPLIES) IMPLANT
PADDING CAST ABS COTTON 4X4 ST (CAST SUPPLIES) ×1 IMPLANT
PADDING CAST COTTON 3X4 STRL (CAST SUPPLIES)
SPLINT FNGR BALL END 5/8X4.25 (SOFTGOODS) ×1 IMPLANT
SPLINT PLASTALUME BALL 4 1/4IN (SOFTGOODS) ×2
SPLINT PLASTER CAST XFAST 3X15 (CAST SUPPLIES) IMPLANT
SPLINT PLASTER XTRA FASTSET 3X (CAST SUPPLIES)
SPONGE GAUZE 4X4 12PLY (GAUZE/BANDAGES/DRESSINGS) ×2 IMPLANT
STOCKINETTE 4X48 STRL (DRAPES) ×2 IMPLANT
SUT VIC AB 4-0 P2 18 (SUTURE) IMPLANT
SUT VICRYL RAPID 5 0 P 3 (SUTURE) IMPLANT
SUT VICRYL RAPIDE 4/0 PS 2 (SUTURE) ×2 IMPLANT
SYR BULB 3OZ (MISCELLANEOUS) ×2 IMPLANT
SYR CONTROL 10ML LL (SYRINGE) ×2 IMPLANT
TOWEL OR 17X24 6PK STRL BLUE (TOWEL DISPOSABLE) ×4 IMPLANT
UNDERPAD 30X30 INCONTINENT (UNDERPADS AND DIAPERS) ×2 IMPLANT
WATER STERILE IRR 1000ML POUR (IV SOLUTION) ×2 IMPLANT

## 2012-10-09 NOTE — Anesthesia Preprocedure Evaluation (Signed)
Anesthesia Evaluation  Patient identified by MRN, date of birth, ID band Patient awake    Reviewed: Allergy & Precautions, H&P , NPO status , Patient's Chart, lab work & pertinent test results  History of Anesthesia Complications Negative for: history of anesthetic complications  Airway Mallampati: I TM Distance: >3 FB Neck ROM: Full    Dental  (+) Teeth Intact and Dental Advisory Given   Pulmonary Recent URI ,  breath sounds clear to auscultation  Pulmonary exam normal       Cardiovascular hypertension, Pt. on medications Rhythm:Regular Rate:Normal  '10 ECHO: normal LVF, normal valves '10 Stress: normal perfusion, no ischemia or infarct   Neuro/Psych negative neurological ROS     GI/Hepatic negative GI ROS, Neg liver ROS,   Endo/Other  Hypothyroidism   Renal/GU negative Renal ROS     Musculoskeletal   Abdominal   Peds  Hematology   Anesthesia Other Findings   Reproductive/Obstetrics                           Anesthesia Physical Anesthesia Plan  ASA: II  Anesthesia Plan: Bier Block and MAC   Post-op Pain Management:    Induction:   Airway Management Planned:   Additional Equipment:   Intra-op Plan:   Post-operative Plan:   Informed Consent: I have reviewed the patients History and Physical, chart, labs and discussed the procedure including the risks, benefits and alternatives for the proposed anesthesia with the patient or authorized representative who has indicated his/her understanding and acceptance.   Dental advisory given  Plan Discussed with: CRNA and Surgeon  Anesthesia Plan Comments: (Plan routine monitors, MAC with Bier block)        Anesthesia Quick Evaluation

## 2012-10-09 NOTE — Anesthesia Procedure Notes (Signed)
Procedure Name: LMA Insertion Date/Time: 10/09/2012 2:50 PM Performed by: Verlan Friends Pre-anesthesia Checklist: Patient identified, Emergency Drugs available, Suction available, Patient being monitored and Timeout performed Patient Re-evaluated:Patient Re-evaluated prior to inductionOxygen Delivery Method: Circle System Utilized Preoxygenation: Pre-oxygenation with 100% oxygen Intubation Type: IV induction Ventilation: Mask ventilation without difficulty LMA: LMA inserted LMA Size: 4.0 Number of attempts: 1 Airway Equipment and Method: bite block Placement Confirmation: positive ETCO2 Tube secured with: Tape Dental Injury: Teeth and Oropharynx as per pre-operative assessment

## 2012-10-09 NOTE — Anesthesia Postprocedure Evaluation (Signed)
  Anesthesia Post-op Note  Patient: Julia Hess  Procedure(s) Performed: Procedure(s) (LRB) with comments: EXCISION METACARPAL MASS (Right) - EXCISION CYST, DEBRIDEMENT DIP JOINT ROTATION DORSAL FLAP RIGHT RING FINGER  Patient Location: PACU  Anesthesia Type:General  Level of Consciousness: awake, alert , oriented and patient cooperative  Airway and Oxygen Therapy: Patient Spontanous Breathing  Post-op Pain: none  Post-op Assessment: Post-op Vital signs reviewed, Patient's Cardiovascular Status Stable, Respiratory Function Stable, Patent Airway, No signs of Nausea or vomiting and Pain level controlled  Post-op Vital Signs: Reviewed and stable  Complications: No apparent anesthesia complications

## 2012-10-09 NOTE — Transfer of Care (Signed)
Immediate Anesthesia Transfer of Care Note  Patient: Julia Hess  Procedure(s) Performed: Procedure(s) (LRB) with comments: EXCISION METACARPAL MASS (Right) - EXCISION CYST, DEBRIDEMENT DIP JOINT ROTATION DORSAL FLAP RIGHT RING FINGER  Patient Location: PACU  Anesthesia Type:General  Level of Consciousness: awake  Airway & Oxygen Therapy: Patient Spontanous Breathing and Patient connected to face mask oxygen  Post-op Assessment: Report given to PACU RN and Post -op Vital signs reviewed and stable  Post vital signs: Reviewed and stable  Complications: No apparent anesthesia complications

## 2012-10-09 NOTE — H&P (Signed)
Julia Hess is a 67 year old right hand dominant female who comes in complaining of a mass on the dorsal aspect of her right index finger for the past 6 months to a year. She recalls no history of injury to it. It is not particularly painful for her. She states that 2 weeks ago it opened up and drained. She has had a mucoid cyst removed from her left hand in the past. She has no history of diabetes. She a history of thyroid problems, arthritis and gout. Her cyst was removed from her left middle finger and has not returned. Takia returns for follow up evaluation of her mucoid cyst excision index and  ring finger. The ring finger has shown a recurrence. The index is doing quite well.  Injection has not resolved it.  PAST MEDICAL HISTORY: She is on the following medications: Calcium, cranberry, Crestor, Desonide, Estrace, Losartan potassium, Nitrofurantoin, Synthroid, and vitamin D. She is allergic to Compazine. She has had T&A, tubal ligation, broken ankle, cyst removed from her finger in 2011, and thyroidectomy.  FAMILY H ISTORY: Positive for high BP, heart disease and arthritis.  SOCIAL HISTORY: She does not smoke or drink. She is married and a Veterinary surgeon.  REVIEW OF SYSTEMS: Positive cataracts, ringing in her ears, high BP, heart attack, easy bleeding, otherwise normal. Julia Hess is an 68 y.o. female.   Chief Complaint: recurrent mucoid tumor   HPI: See above  Past Medical History  Diagnosis Date  . HTN (hypertension)   . HLD (hyperlipidemia)   . Hypothyroidism   . Frequent UTI   . Arthritis     Past Surgical History  Procedure Date  . Uterine fibroid surgery 2012  . Tonsillectomy   . Tubal ligation   . Finger ganglion cyst excision 2011  . Fracture surgery     lt ankle  . Mass excision 07/12/2012    Procedure: EXCISION MASS;  Surgeon: Nicki Reaper, MD;  Location: [facility];  Service: Orthopedics;  Laterality: Right;  Excision Cyst Right Index and Right Ring  Finger and Debridement DIP Right Index Finger and Right Ring Finger    Family History  Problem Relation Age of Onset  . Heart attack     Social History:  reports that she has never smoked. She does not have any smokeless tobacco history on file. She reports that she drinks alcohol. She reports that she does not use illicit drugs.  Allergies:  Allergies  Allergen Reactions  . Compazine     Medications Prior to Admission  Medication Sig Dispense Refill  . aspirin 81 MG tablet Take 81 mg by mouth daily.        Marland Kitchen estradiol (ESTRACE) 0.1 MG/GM vaginal cream Place 2 g vaginally as needed.        Marland Kitchen levothyroxine (SYNTHROID, LEVOTHROID) 50 MCG tablet Take 50 mcg by mouth daily.        Marland Kitchen losartan (COZAAR) 50 MG tablet Take 50 mg by mouth daily.      . rosuvastatin (CRESTOR) 10 MG tablet Take 10 mg by mouth daily.          No results found for this or any previous visit (from the past 48 hour(s)).  No results found.   Pertinent items are noted in HPI.  Blood pressure 118/80, pulse 85, temperature 98.4 F (36.9 C), temperature source Oral, resp. rate 16, height 5\' 2"  (1.575 m), weight 58.06 kg (128 lb), SpO2 98.00%.  General appearance: alert, cooperative and appears  stated age Head: Normocephalic, without obvious abnormality Neck: no adenopathy Resp: clear to auscultation bilaterally Cardio: regular rate and rhythm, S1, S2 normal, no murmur, click, rub or gallop GI: soft, non-tender; bowel sounds normal; no masses,  no organomegaly Extremities: extremities normal, atraumatic, no cyanosis or edema Pulses: 2+ and symmetric Skin: Skin color, texture, turgor normal. No rashes or lesions Neurologic: Grossly normal Incision/Wound: na  Assessment/Plan Re-excision cyst and rotation flap ring finger.  Euna Armon R 10/09/2012, 2:09 PM

## 2012-10-09 NOTE — Op Note (Signed)
Dictation Number [id number]

## 2012-10-09 NOTE — Brief Op Note (Signed)
10/09/2012  3:31 PM  PATIENT:  Julia Hess  67 y.o. female  PRE-OPERATIVE DIAGNOSIS:  RECURRENT MUCOID TUMOR RIGHT RING FINGER  POST-OPERATIVE DIAGNOSIS:  RECURRENT MUCOID TUMOR RIGHT RING FINGER  PROCEDURE:  Procedure(s) (LRB) with comments: EXCISION METACARPAL MASS (Right) - EXCISION CYST, DEBRIDEMENT DIP JOINT ROTATION DORSAL FLAP RIGHT RING FINGER  SURGEON:  Surgeon(s) and Role:    * Nicki Reaper, MD - Primary  PHYSICIAN ASSISTANT:   ASSISTANTS: none   ANESTHESIA:   local and general  EBL:  Total I/O In: 1300 [I.V.:1300] Out: -   BLOOD ADMINISTERED:none  DRAINS: none   LOCAL MEDICATIONS USED:  MARCAINE     SPECIMEN:  Excision  DISPOSITION OF SPECIMEN:  PATHOLOGY  COUNTS:  YES  TOURNIQUET:   Total Tourniquet Time Documented: Forearm (Right) - 24 minutes  DICTATION: .Other Dictation: Dictation Number [id number]  PLAN OF CARE: Discharge to home after PACU  PATIENT DISPOSITION:  PACU - hemodynamically stable.

## 2012-10-10 LAB — POCT I-STAT, CHEM 8
BUN: 17 mg/dL (ref 6–23)
Calcium, Ion: 1.11 mmol/L — ABNORMAL LOW (ref 1.13–1.30)
TCO2: 26 mmol/L (ref 0–100)

## 2012-10-10 NOTE — Op Note (Signed)
NAMEBLENDA, WISECUP NO.:  [id number]  MEDICAL RECORD NO.:  [id number]  LOCATION:                                 FACILITY:  PHYSICIAN:  Cindee Salt, M.D.            DATE OF BIRTH:  DATE OF PROCEDURE:  10/09/2012 DATE OF DISCHARGE:                              OPERATIVE REPORT   PREOPERATIVE DIAGNOSIS:  Recurrent mucoid tumor, right ring finger.  POSTOPERATIVE DIAGNOSIS:  Recurrent mucoid tumor, right ring finger.  OPERATION:  Excision of mucoid tumor, debridement distal interphalangeal joint, right ring finger with rotation dorsal flap.  SURGEON:  Cindee Salt, M.D.  ANESTHESIA:  General with metacarpal block.  ANESTHESIOLOGIST:  Germaine Pomfret, M.D.  HISTORY:  The patient is a 67 year old female with a history of removal of a mucoid tumor from her right ring finger approximately 4 months ago. This has recurred.  This is significantly within the skin.  This has not responded to joint injections or injection into the cyst.  She is desirous of having this excised prior to having it ruptured.  Pre, peri, and postoperative course have been discussed along with risks and complications.  She is aware that there is no guarantee with surgery; possibility of infection; recurrence of injury to arteries, nerves, tendons, incomplete relief of symptoms, dystrophy, that the only way to ensure nonoccurrence is fusion of the joint which she is desirous having done.  In the preoperative area, the patient is seen, the extremity marked by both the patient and surgeon.  Antibiotic given.  PROCEDURE:  The patient was brought to the operating room where a general anesthetic was carried out without difficulty.  She was prepped using ChloraPrep, supine position, right arm free.  A 3-minute dry time was allowed.  Time-out taken, confirming patient and procedure.  The limb was exsanguinated with an Esmarch bandage.  Tourniquet placed on the forearm was inflated to 250 mmHg.   A curvilinear incision was made over the distal and middle phalanx of the right ring finger carried down through subcutaneous tissue taking care to protect the neurovascular bundles and structures beneath.  The cyst was immediately encountered. With blunt and sharp dissection, this was dissected free.  The joint opened, debridement performed with a curette and small rongeur.  This allowed visualization, no further degenerative changes.  The joint was irrigated.  The flap was then elevated bringing the incision back to the level of the PIP joint and then curved also to allow rotation of the dorsal skin for coverage.  The area was undermined maintaining the neurovascular structures throughout the entire flap.  This was then rotated distally.  The remainder of the thin skin was excised, the flap was then sutured in position with interrupted 5-0 Vicryl Rapide sutures. The tourniquet was deflated.  The entire flap immediately pinked. Sterile compressive dressing and splint with the finger fully extended at the distal interphalangeal joint was applied.  On deflation of the tourniquet, all fingers had pinked, and she was taken to the recovery room for observation in satisfactory condition.  She will be discharged home to return to the [facility] [place] in 1 week  on Vicodin.          ______________________________ Cindee Salt, M.D.     GK/MEDQ  D:  10/09/2012  T:  10/10/2012  Job:  [id number]

## 2019-10-20 ENCOUNTER — Ambulatory Visit: Payer: Medicare PPO | Attending: Internal Medicine

## 2019-10-20 DIAGNOSIS — Z23 Encounter for immunization: Secondary | ICD-10-CM | POA: Insufficient documentation

## 2019-10-20 NOTE — Progress Notes (Signed)
   Covid-19 Vaccination Clinic  Name:  Julia Hess    MRN: [id number] DOB: May 26, 1946  10/20/2019  [name] was observed post Covid-19 immunization for 15 minutes without incidence. She was provided with Vaccine Information Sheet and instruction to access the V-Safe system.   [name] was instructed to call 911 with any severe reactions post vaccine: Marland Kitchen Difficulty breathing  . Swelling of your face and throat  . A fast heartbeat  . A bad rash all over your body  . Dizziness and weakness    Immunizations Administered    Name Date Dose VIS Date Route   Pfizer COVID-19 Vaccine 10/20/2019 10:49 AM 0.3 mL 09/07/2019 Intramuscular   Manufacturer: [employer], [employer]   Lot: [id number]   NDC: [address]-0

## 2019-10-27 ENCOUNTER — Ambulatory Visit: Payer: Self-pay

## 2019-11-07 ENCOUNTER — Ambulatory Visit: Payer: Self-pay

## 2019-11-10 ENCOUNTER — Ambulatory Visit: Payer: Medicare PPO | Attending: Internal Medicine

## 2019-11-10 DIAGNOSIS — Z23 Encounter for immunization: Secondary | ICD-10-CM | POA: Insufficient documentation

## 2019-11-10 NOTE — Progress Notes (Signed)
   Covid-19 Vaccination Clinic  Name:  [name]    MRN: [id number] DOB: 04/08/1946  11/10/2019  [name] was observed post Covid-19 immunization for 15 minutes without incidence. She was provided with Vaccine Information Sheet and instruction to access the V-Safe system.   [name] was instructed to call 911 with any severe reactions post vaccine: Marland Kitchen Difficulty breathing  . Swelling of your face and throat  . A fast heartbeat  . A bad rash all over your body  . Dizziness and weakness    Immunizations Administered    Name Date Dose VIS Date Route   Pfizer COVID-19 Vaccine 11/10/2019 12:43 PM 0.3 mL 09/07/2019 Intramuscular   Manufacturer: [employer], [employer]   Lot: [id number]   NDC: [address]-2

## 2020-01-22 NOTE — H&P (Signed)
TOTAL HIP ADMISSION H&P  Patient is admitted for right total hip arthroplasty.  Subjective:  Chief Complaint: Right hip pain  HPI: Julia Hess, 74 y.o. female, has a history of pain and functional disability in the right hip due to arthritis and patient has failed non-surgical conservative treatments for greater than 12 weeks to include corticosteriod injections and activity modification. Onset of symptoms was gradual, starting several years ago with gradually worsening course since that time. The patient noted no past surgery on the right hip. Patient currently rates pain in the right hip at 7 out of 10 with activity. Patient has worsening of pain with activity and weight bearing, pain that interfers with activities of daily living and crepitus. Patient has evidence of bone-on-bone arthritis in the right hip with large osteophyte formation and subchondral cystic formation by imaging studies. This condition presents safety issues increasing the risk of falls. There is no current active infection.  There are no problems to display for this patient.   Past Medical History:  Diagnosis Date  . Arthritis   . Frequent UTI   . HLD (hyperlipidemia)   . HTN (hypertension)   . Hypothyroidism     Past Surgical History:  Procedure Laterality Date  . FINGER GANGLION CYST EXCISION  2011  . FRACTURE SURGERY     lt ankle  . MASS EXCISION  07/12/2012   Procedure: EXCISION MASS;  Surgeon: Wynonia Sours, MD;  Location: [facility];  Service: Orthopedics;  Laterality: Right;  Excision Cyst Right Index and Right Ring Finger and Debridement DIP Right Index Finger and Right Ring Finger  . TONSILLECTOMY    . TUBAL LIGATION    . UTERINE FIBROID SURGERY  2012    Prior to Admission medications   Medication Sig Start Date End Date Taking? Authorizing Provider  aspirin 81 MG tablet Take 81 mg by mouth daily.      [provider]  estradiol (ESTRACE) 0.1 MG/GM vaginal cream Place 2 g  vaginally as needed.      [provider]  HYDROcodone-acetaminophen (NORCO) 5-325 MG per tablet Take 1 tablet by mouth every 6 (six) hours as needed for pain. 10/09/12   Daryll Brod, MD  levothyroxine (SYNTHROID, LEVOTHROID) 50 MCG tablet Take 50 mcg by mouth daily.      [provider]  losartan (COZAAR) 50 MG tablet Take 50 mg by mouth daily.    [provider]  rosuvastatin (CRESTOR) 10 MG tablet Take 10 mg by mouth daily.      [provider]    Allergies  Allergen Reactions  . Compazine     Social History   Socioeconomic History  . Marital status: Married    Spouse name: Not on file  . Number of children: Not on file  . Years of education: Not on file  . Highest education level: Not on file  Occupational History  . Not on file  Tobacco Use  . Smoking status: Never Smoker  Substance and Sexual Activity  . Alcohol use: Yes  . Drug use: No  . Sexual activity: Not on file  Other Topics Concern  . Not on file  Social History Narrative  . Not on file   Social Determinants of Health   Financial Resource Strain:   . Difficulty of Paying Living Expenses:   Food Insecurity:   . Worried About Charity fundraiser in the Last Year:   . [place] in the Last Year:  Transportation Needs:   . Freight forwarder (Medical):   Marland Kitchen Lack of Transportation (Non-Medical):   Physical Activity:   . Days of Exercise per Week:   . Minutes of Exercise per Session:   Stress:   . Feeling of Stress :   Social Connections:   . Frequency of Communication with Friends and Family:   . Frequency of Social Gatherings with Friends and Family:   . Attends Religious Services:   . Active Member of Clubs or Organizations:   . Attends Banker Meetings:   Marland Kitchen Marital Status:   Intimate Partner Violence:   . Fear of Current or Ex-Partner:   . Emotionally Abused:   Marland Kitchen Physically Abused:   . Sexually Abused:       Tobacco Use:   . Smoking  Tobacco Use:   . Smokeless Tobacco Use:    Social History   Substance and Sexual Activity  Alcohol Use Yes    Family History  Problem Relation Age of Onset  . Heart attack Unknown     Review of Systems  Constitutional: Negative for chills and fever.  HENT: Negative for congestion, sore throat and tinnitus.   Eyes: Negative for double vision, photophobia and pain.  Respiratory: Negative for cough, shortness of breath and wheezing.   Cardiovascular: Negative for chest pain, palpitations and orthopnea.  Gastrointestinal: Negative for heartburn, nausea and vomiting.  Genitourinary: Negative for dysuria, frequency and urgency.  Musculoskeletal: Positive for joint pain.  Neurological: Negative for dizziness, weakness and headaches.     Objective:  Physical Exam: Well nourished and well developed.  General: Alert and oriented x3, cooperative and pleasant, no acute distress.  Head: normocephalic, atraumatic, neck supple.  Eyes: EOMI.  Respiratory: breath sounds clear in all fields, no wheezing, rales, or rhonchi. Cardiovascular: Regular rate and rhythm, no murmurs, gallops or rubs.  Abdomen: non-tender to palpation and soft, normoactive bowel sounds. Musculoskeletal:  Right Hip Exam: The range of motion: Flexion to 110 degrees, no internal rotation, External Rotation to 10-15 degrees, and abduction to 20 degrees without discomfort. The rotational maneuvers in the abduction are painful. There is no tenderness over the greater trochanteric bursa.   Calves soft and nontender. Motor function intact in LE. Strength 5/5 LE bilaterally. Neuro: Distal pulses 2+. Sensation to light touch intact in LE.  Imaging Review Plain radiographs demonstrate severe degenerative joint disease of the right hip. The bone quality appears to be adequate for age and reported activity level.  Assessment/Plan:  End stage arthritis, right hip  The patient history, physical examination, clinical  judgement of the provider and imaging studies are consistent with end stage degenerative joint disease of the right hip and total hip arthroplasty is deemed medically necessary. The treatment options including medical management, injection therapy, arthroscopy and arthroplasty were discussed at length. The risks and benefits of total hip arthroplasty were presented and reviewed. The risks due to aseptic loosening, infection, stiffness, dislocation/subluxation, thromboembolic complications and other imponderables were discussed. The patient acknowledged the explanation, agreed to proceed with the plan and consent was signed. Patient is being admitted for inpatient treatment for surgery, pain control, PT, OT, prophylactic antibiotics, VTE prophylaxis, progressive ambulation and ADLs and discharge planning.The patient is planning to be discharged home.   Patient's anticipated LOS is less than 2 midnights, meeting these requirements: - Younger than 77 - Lives within 1 hour of care - Has a competent adult at home to recover with post-op recover - NO history of  -  Chronic pain requiring opiods  - Diabetes  - Coronary Artery Disease  - Heart failure  - Heart attack  - Stroke  - DVT/VTE  - Cardiac arrhythmia  - Respiratory Failure/COPD  - Renal failure  - Anemia  - Advanced Liver disease  Therapy Plans: HEP Disposition: Home with husband, son, and daughter Planned DVT Prophylaxis: Xarelto 10 mg QD DME Needed: Dan Humphreys PCP: Luna Kitchens, MD (clearance provided) TXA: IV Allergies: Compazine Anesthesia Concerns: None BMI: 24.5  Other: - Hx of MI found incidentally by EKG - Possible SDD  - Patient was instructed on what medications to stop prior to surgery. - Follow-up visit in 2 weeks with Dr. Lequita Halt - Begin physical therapy following surgery - Pre-operative lab work as pre-surgical testing - Prescriptions will be provided in hospital at time of discharge  Arther Abbott,  PA-C Orthopedic Surgery [facility] [place]

## 2020-01-30 DIAGNOSIS — F4322 Adjustment disorder with anxiety: Secondary | ICD-10-CM | POA: Diagnosis not present

## 2020-02-08 NOTE — Patient Instructions (Addendum)
DUE TO COVID-19 ONLY ONE VISITOR IS ALLOWED TO COME WITH YOU AND STAY IN THE WAITING ROOM ONLY DURING PRE OP AND PROCEDURE DAY OF SURGERY. THE 1 VISITOR MAY VISIT WITH YOU AFTER SURGERY IN YOUR PRIVATE ROOM DURING VISITING HOURS ONLY!  YOU NEED TO HAVE A COVID 19 TEST ON: 02/16/20 @   1:00 pm , THIS TEST MUST BE DONE BEFORE SURGERY, COME  [address], [place] , [address].  [facility]) ONCE YOUR COVID TEST IS COMPLETED, PLEASE BEGIN THE QUARANTINE INSTRUCTIONS AS OUTLINED IN YOUR HANDOUT.                Julia Hess   Your procedure is scheduled on: 02/20/20   Report to [facility] Main  Entrance   Report to short stay at: 5:30 AM     Call this number if you have problems the morning of surgery [phone]    Remember:   NO SOLID FOOD AFTER MIDNIGHT THE NIGHT PRIOR TO SURGERY. NOTHING BY MOUTH EXCEPT CLEAR LIQUIDS UNTIL: 4:30 am . PLEASE FINISH ENSURE DRINK PER SURGEON ORDER  WHICH NEEDS TO BE COMPLETED AT: 4:30 am .   CLEAR LIQUID DIET   Foods Allowed                                                                     Foods Excluded  Coffee and tea, regular and decaf                             liquids that you cannot  Plain Jell-O any favor except red or purple                                           see through such as: Fruit ices (not with fruit pulp)                                     milk, soups, orange juice  Iced Popsicles                                    All solid food Carbonated beverages, regular and diet                                    Cranberry, grape and apple juices Sports drinks like Gatorade Lightly seasoned clear broth or consume(fat free) Sugar, honey syrup  Sample Menu Breakfast                                Lunch                                     Supper Cranberry juice  Beef broth                            Chicken broth Jell-O                                     Grape juice                            Apple juice Coffee or tea                        Jell-O                                      Popsicle                                                Coffee or tea                        Coffee or tea  _____________________________________________________________________   BRUSH YOUR TEETH MORNING OF SURGERY AND RINSE YOUR MOUTH OUT, NO CHEWING GUM CANDY OR MINTS.     Take these medicines the morning of surgery with A SIP OF WATER: levothyroxine.                                 You may not have any metal on your body including hair pins and              piercings  Do not wear jewelry, make-up, lotions, powders or perfumes, deodorant             Do not wear nail polish on your fingernails.  Do not shave  48 hours prior to surgery.            Do not bring valuables to the hospital. [place].  Contacts, dentures or bridgework may not be worn into surgery.  Leave suitcase in the car. After surgery it may be brought to your room.     Patients discharged the day of surgery will not be allowed to drive home. IF YOU ARE HAVING SURGERY AND GOING HOME THE SAME DAY, YOU MUST HAVE AN ADULT TO DRIVE YOU HOME AND BE WITH YOU FOR 24 HOURS. YOU MAY GO HOME BY TAXI OR UBER OR ORTHERWISE, BUT AN ADULT MUST ACCOMPANY YOU HOME AND STAY WITH YOU FOR 24 HOURS.  Name and phone number of your driver:  Special Instructions: N/A              Please read over the following fact sheets you were given: _____________________________________________________________________             [facility] - Preparing for Surgery Before surgery, you can play an important role.  Because skin is not sterile, your skin needs to be as free of germs as possible.  You can reduce the number of germs on your  skin by washing with CHG (chlorahexidine gluconate) soap before surgery.  CHG is an antiseptic cleaner which kills germs and bonds with the skin to continue killing germs  even after washing. Please DO NOT use if you have an allergy to CHG or antibacterial soaps.  If your skin becomes reddened/irritated stop using the CHG and inform your nurse when you arrive at Short Stay. Do not shave (including legs and underarms) for at least 48 hours prior to the first CHG shower.  You may shave your face/neck. Please follow these instructions carefully:  1.  Shower with CHG Soap the night before surgery and the  morning of Surgery.  2.  If you choose to wash your hair, wash your hair first as usual with your  normal  shampoo.  3.  After you shampoo, rinse your hair and body thoroughly to remove the  shampoo.                           4.  Use CHG as you would any other liquid soap.  You can apply chg directly  to the skin and wash                       Gently with a scrungie or clean washcloth.  5.  Apply the CHG Soap to your body ONLY FROM THE NECK DOWN.   Do not use on face/ open                           Wound or open sores. Avoid contact with eyes, ears mouth and genitals (private parts).                       Wash face,  Genitals (private parts) with your normal soap.             6.  Wash thoroughly, paying special attention to the area where your surgery  will be performed.  7.  Thoroughly rinse your body with warm water from the neck down.  8.  DO NOT shower/wash with your normal soap after using and rinsing off  the CHG Soap.                9.  Pat yourself dry with a clean towel.            10.  Wear clean pajamas.            11.  Place clean sheets on your bed the night of your first shower and do not  sleep with pets. Day of Surgery : Do not apply any lotions/deodorants the morning of surgery.  Please wear clean clothes to the hospital/surgery center.  FAILURE TO FOLLOW THESE INSTRUCTIONS MAY RESULT IN THE CANCELLATION OF YOUR SURGERY PATIENT SIGNATURE_________________________________  NURSE  SIGNATURE__________________________________  ________________________________________________________________________   Julia Hess  An incentive spirometer is a tool that can help keep your lungs clear and active. This tool measures how well you are filling your lungs with each breath. Taking long deep breaths may help reverse or decrease the chance of developing breathing (pulmonary) problems (especially infection) following:  A long period of time when you are unable to move or be active. BEFORE THE PROCEDURE   If the spirometer includes an indicator to show your best effort, your nurse or respiratory therapist will set it to a desired goal.  If possible,  sit up straight or lean slightly forward. Try not to slouch.  Hold the incentive spirometer in an upright position. INSTRUCTIONS FOR USE  1. Sit on the edge of your bed if possible, or sit up as far as you can in bed or on a chair. 2. Hold the incentive spirometer in an upright position. 3. Breathe out normally. 4. Place the mouthpiece in your mouth and seal your lips tightly around it. 5. Breathe in slowly and as deeply as possible, raising the piston or the ball toward the top of the column. 6. Hold your breath for 3-5 seconds or for as long as possible. Allow the piston or ball to fall to the bottom of the column. 7. Remove the mouthpiece from your mouth and breathe out normally. 8. Rest for a few seconds and repeat Steps 1 through 7 at least 10 times every 1-2 hours when you are awake. Take your time and take a few normal breaths between deep breaths. 9. The spirometer may include an indicator to show your best effort. Use the indicator as a goal to work toward during each repetition. 10. After each set of 10 deep breaths, practice coughing to be sure your lungs are clear. If you have an incision (the cut made at the time of surgery), support your incision when coughing by placing a pillow or rolled up towels firmly  against it. Once you are able to get out of bed, walk around indoors and cough well. You may stop using the incentive spirometer when instructed by your caregiver.  RISKS AND COMPLICATIONS  Take your time so you do not get dizzy or light-headed.  If you are in pain, you may need to take or ask for pain medication before doing incentive spirometry. It is harder to take a deep breath if you are having pain. AFTER USE  Rest and breathe slowly and easily.  It can be helpful to keep track of a log of your progress. Your caregiver can provide you with a simple table to help with this. If you are using the spirometer at home, follow these instructions: [place] IF:   You are having difficultly using the spirometer.  You have trouble using the spirometer as often as instructed.  Your pain medication is not giving enough relief while using the spirometer.  You develop fever of 100.5 F (38.1 C) or higher. SEEK IMMEDIATE MEDICAL CARE IF:   You cough up bloody sputum that had not been present before.  You develop fever of 102 F (38.9 C) or greater.  You develop worsening pain at or near the incision site. MAKE SURE YOU:   Understand these instructions.  Will watch your condition.  Will get help right away if you are not doing well or get worse. Document Released: 01/24/2007 Document Revised: 12/06/2011 Document Reviewed: 03/27/2007 [facility] Patient Information 2014 [place], [place].   ________________________________________________________________________

## 2020-02-11 ENCOUNTER — Encounter (HOSPITAL_COMMUNITY): Payer: Self-pay

## 2020-02-11 ENCOUNTER — Encounter (HOSPITAL_COMMUNITY)
Admission: RE | Admit: 2020-02-11 | Discharge: 2020-02-11 | Disposition: A | Discharge status: Home / Self Care | Payer: Medicare PPO | Source: Ambulatory Visit | Attending: Orthopedic Surgery | Admitting: Orthopedic Surgery

## 2020-02-11 ENCOUNTER — Other Ambulatory Visit: Payer: Self-pay

## 2020-02-11 DIAGNOSIS — Z01818 Encounter for other preprocedural examination: Secondary | ICD-10-CM | POA: Insufficient documentation

## 2020-02-11 LAB — COMPREHENSIVE METABOLIC PANEL
ALT: 18 U/L (ref 0–44)
AST: 22 U/L (ref 15–41)
Albumin: 3.9 g/dL (ref 3.5–5.0)
Alkaline Phosphatase: 46 U/L (ref 38–126)
Anion gap: 8 (ref 5–15)
BUN: 19 mg/dL (ref 8–23)
CO2: 27 mmol/L (ref 22–32)
Calcium: 8.9 mg/dL (ref 8.9–10.3)
Chloride: 108 mmol/L (ref 98–111)
Creatinine, Ser: 0.86 mg/dL (ref 0.44–1.00)
GFR calc Af Amer: >60 mL/min (ref >60)
GFR calc non Af Amer: >60 mL/min (ref >60)
Glucose, Bld: 95 mg/dL (ref 70–99)
Potassium: 4 mmol/L (ref 3.5–5.1)
Sodium: 143 mmol/L (ref 135–145)
Total Bilirubin: 0.6 mg/dL (ref 0.3–1.2)
Total Protein: 6.4 g/dL — ABNORMAL LOW (ref 6.5–8.1)

## 2020-02-11 LAB — CBC
HCT: 44.6 % (ref 36.0–46.0)
Hemoglobin: 14.5 g/dL (ref 12.0–15.0)
MCH: 30.5 pg (ref 26.0–34.0)
MCHC: 32.5 g/dL (ref 30.0–36.0)
MCV: 93.9 fL (ref 80.0–100.0)
Platelets: 252 10*3/uL (ref 150–400)
RBC: 4.75 MIL/uL (ref 3.87–5.11)
RDW: 13.5 % (ref 11.5–15.5)
WBC: 8 10*3/uL (ref 4.0–10.5)
nRBC: 0 % (ref 0.0–0.2)

## 2020-02-11 LAB — PROTIME-INR
INR: 1 (ref 0.8–1.2)
Prothrombin Time: 12.8 seconds (ref 11.4–15.2)

## 2020-02-11 LAB — APTT: aPTT: 33 seconds (ref 24–36)

## 2020-02-11 LAB — SURGICAL PCR SCREEN
MRSA, PCR: NEGATIVE
Staphylococcus aureus: NEGATIVE

## 2020-02-11 NOTE — Progress Notes (Signed)
PCP - Dr. Welton Flakes. :Clearance: 12/20/19: chart  Cardiologist -   Chest x-ray -  EKG -  Stress Test -  ECHO -  Cardiac Cath -   Sleep Study -  CPAP -   Fasting Blood Sugar -  Checks Blood Sugar _____ times a day  Blood Thinner Instructions: Aspirin Instructions: Last Dose:  Anesthesia review:   Patient denies shortness of breath, fever, cough and chest pain at PAT appointment   Patient verbalized understanding of instructions that were given to them at the PAT appointment. Patient was also instructed that they will need to review over the PAT instructions again at home before surgery.

## 2020-02-12 LAB — ABO/RH: ABO/RH(D): A POS

## 2020-02-13 DIAGNOSIS — F4322 Adjustment disorder with anxiety: Secondary | ICD-10-CM | POA: Diagnosis not present

## 2020-02-16 ENCOUNTER — Other Ambulatory Visit (HOSPITAL_COMMUNITY)
Admission: RE | Admit: 2020-02-16 | Discharge: 2020-02-16 | Disposition: A | Discharge status: Home / Self Care | Payer: Medicare PPO | Source: Ambulatory Visit | Attending: Orthopedic Surgery | Admitting: Orthopedic Surgery

## 2020-02-16 DIAGNOSIS — Z20822 Contact with and (suspected) exposure to covid-19: Secondary | ICD-10-CM | POA: Diagnosis not present

## 2020-02-16 DIAGNOSIS — Z01812 Encounter for preprocedural laboratory examination: Secondary | ICD-10-CM | POA: Insufficient documentation

## 2020-02-16 LAB — SARS CORONAVIRUS 2 (TAT 6-24 HRS): SARS Coronavirus 2: NEGATIVE

## 2020-02-19 NOTE — Anesthesia Preprocedure Evaluation (Addendum)
Anesthesia Evaluation  Patient identified by MRN, date of birth, ID band Patient awake    Reviewed: Allergy & Precautions, NPO status , Patient's Chart, lab work & pertinent test results  Airway Mallampati: II  TM Distance: >3 FB Neck ROM: Full    Dental  (+) Teeth Intact, Dental Advisory Given   Pulmonary neg pulmonary ROS,    Pulmonary exam normal breath sounds clear to auscultation       Cardiovascular hypertension, Pt. on medications Normal cardiovascular exam Rhythm:Regular Rate:Normal     Neuro/Psych negative neurological ROS  negative psych ROS   GI/Hepatic negative GI ROS, Neg liver ROS,   Endo/Other  Hypothyroidism   Renal/GU negative Renal ROS     Musculoskeletal  (+) Arthritis , Osteoarthritis,  right hip osteoarthritis   Abdominal   Peds  Hematology negative hematology ROS (+) Plt 252k   Anesthesia Other Findings   Reproductive/Obstetrics                            Anesthesia Physical Anesthesia Plan  ASA: II  Anesthesia Plan: Spinal   Post-op Pain Management:    Induction: Intravenous  PONV Risk Score and Plan: 2 and Propofol infusion and Treatment may vary due to age or medical condition  Airway Management Planned: Natural Airway and Nasal Cannula  Additional Equipment:   Intra-op Plan:   Post-operative Plan:   Informed Consent: I have reviewed the patients History and Physical, chart, labs and discussed the procedure including the risks, benefits and alternatives for the proposed anesthesia with the patient or authorized representative who has indicated his/her understanding and acceptance.     Dental advisory given  Plan Discussed with: CRNA  Anesthesia Plan Comments:        Anesthesia Quick Evaluation

## 2020-02-20 ENCOUNTER — Ambulatory Visit (HOSPITAL_COMMUNITY): Payer: Medicare PPO

## 2020-02-20 ENCOUNTER — Ambulatory Visit (HOSPITAL_COMMUNITY): Payer: Medicare PPO | Admitting: Anesthesiology

## 2020-02-20 ENCOUNTER — Other Ambulatory Visit: Payer: Self-pay

## 2020-02-20 ENCOUNTER — Ambulatory Visit (HOSPITAL_COMMUNITY)
Admission: RE | Admit: 2020-02-20 | Discharge: 2020-02-20 | Disposition: A | Discharge status: Home / Self Care | Payer: Medicare PPO | Attending: Orthopedic Surgery | Admitting: Orthopedic Surgery

## 2020-02-20 ENCOUNTER — Encounter (HOSPITAL_COMMUNITY)
Admission: RE | Disposition: A | Discharge status: Home / Self Care | Payer: Self-pay | Source: Home / Self Care | Attending: Orthopedic Surgery

## 2020-02-20 ENCOUNTER — Encounter (HOSPITAL_COMMUNITY): Payer: Self-pay | Admitting: Orthopedic Surgery

## 2020-02-20 DIAGNOSIS — I110 Hypertensive heart disease with heart failure: Secondary | ICD-10-CM | POA: Insufficient documentation

## 2020-02-20 DIAGNOSIS — M169 Osteoarthritis of hip, unspecified: Secondary | ICD-10-CM | POA: Diagnosis present

## 2020-02-20 DIAGNOSIS — Z419 Encounter for procedure for purposes other than remedying health state, unspecified: Secondary | ICD-10-CM

## 2020-02-20 DIAGNOSIS — M1611 Unilateral primary osteoarthritis, right hip: Secondary | ICD-10-CM | POA: Insufficient documentation

## 2020-02-20 DIAGNOSIS — Z79899 Other long term (current) drug therapy: Secondary | ICD-10-CM | POA: Diagnosis not present

## 2020-02-20 DIAGNOSIS — Z471 Aftercare following joint replacement surgery: Secondary | ICD-10-CM | POA: Diagnosis not present

## 2020-02-20 DIAGNOSIS — E785 Hyperlipidemia, unspecified: Secondary | ICD-10-CM | POA: Insufficient documentation

## 2020-02-20 DIAGNOSIS — I252 Old myocardial infarction: Secondary | ICD-10-CM | POA: Diagnosis not present

## 2020-02-20 DIAGNOSIS — Z79890 Hormone replacement therapy (postmenopausal): Secondary | ICD-10-CM | POA: Insufficient documentation

## 2020-02-20 DIAGNOSIS — I10 Essential (primary) hypertension: Secondary | ICD-10-CM | POA: Diagnosis not present

## 2020-02-20 DIAGNOSIS — Z96641 Presence of right artificial hip joint: Secondary | ICD-10-CM | POA: Diagnosis not present

## 2020-02-20 DIAGNOSIS — E039 Hypothyroidism, unspecified: Secondary | ICD-10-CM | POA: Diagnosis not present

## 2020-02-20 DIAGNOSIS — Z7982 Long term (current) use of aspirin: Secondary | ICD-10-CM | POA: Insufficient documentation

## 2020-02-20 DIAGNOSIS — Z96649 Presence of unspecified artificial hip joint: Secondary | ICD-10-CM

## 2020-02-20 DIAGNOSIS — Z888 Allergy status to other drugs, medicaments and biological substances status: Secondary | ICD-10-CM | POA: Insufficient documentation

## 2020-02-20 HISTORY — PX: TOTAL HIP ARTHROPLASTY: SHX124

## 2020-02-20 LAB — TYPE AND SCREEN
ABO/RH(D): A POS
Antibody Screen: NEGATIVE

## 2020-02-20 SURGERY — ARTHROPLASTY, HIP, TOTAL, ANTERIOR APPROACH
Anesthesia: Spinal | Site: Hip | Laterality: Right

## 2020-02-20 MED ORDER — SODIUM CHLORIDE 0.9 % IV SOLN: INTRAVENOUS | Status: DC

## 2020-02-20 MED ORDER — ONDANSETRON HCL 4 MG/2ML IJ SOLN: 4.0000 mg | Freq: Once | INTRAMUSCULAR | Status: DC | PRN

## 2020-02-20 MED ORDER — CEFAZOLIN SODIUM-DEXTROSE 2-4 GM/100ML-% IV SOLN: 2.0000 g | Freq: Four times a day (QID) | INTRAVENOUS | Status: DC

## 2020-02-20 MED ORDER — FENTANYL CITRATE (PF) 100 MCG/2ML IJ SOLN
INTRAMUSCULAR | Status: AC
  Filled 2020-02-20: qty 2

## 2020-02-20 MED ORDER — PHENYLEPHRINE 40 MCG/ML (10ML) SYRINGE FOR IV PUSH (FOR BLOOD PRESSURE SUPPORT)
PREFILLED_SYRINGE | INTRAVENOUS | Status: DC | PRN
  Administered 2020-02-20 (×2): 40 ug via INTRAVENOUS
  Administered 2020-02-20: 80 ug via INTRAVENOUS

## 2020-02-20 MED ORDER — TRANEXAMIC ACID-NACL 1000-0.7 MG/100ML-% IV SOLN
1000.0000 mg | INTRAVENOUS | Status: AC
  Administered 2020-02-20: 1000 mg via INTRAVENOUS
  Filled 2020-02-20: qty 100

## 2020-02-20 MED ORDER — POVIDONE-IODINE 10 % EX SWAB
2.0000 "application " | Freq: Once | CUTANEOUS | Status: AC
  Administered 2020-02-20: 2 via TOPICAL

## 2020-02-20 MED ORDER — BUPIVACAINE HCL (PF) 0.25 % IJ SOLN
INTRAMUSCULAR | Status: AC
  Filled 2020-02-20: qty 30

## 2020-02-20 MED ORDER — FENTANYL CITRATE (PF) 100 MCG/2ML IJ SOLN
INTRAMUSCULAR | Status: DC | PRN
  Administered 2020-02-20: 25 ug via INTRAVENOUS

## 2020-02-20 MED ORDER — BUPIVACAINE HCL (PF) 0.25 % IJ SOLN
INTRAMUSCULAR | Status: DC | PRN
  Administered 2020-02-20: 30 mL

## 2020-02-20 MED ORDER — CEFAZOLIN SODIUM-DEXTROSE 2-4 GM/100ML-% IV SOLN
2.0000 g | INTRAVENOUS | Status: AC
  Administered 2020-02-20: 2 g via INTRAVENOUS
  Filled 2020-02-20: qty 100

## 2020-02-20 MED ORDER — HYDROCODONE-ACETAMINOPHEN 5-325 MG PO TABS
1.0000 | ORAL_TABLET | ORAL | Status: DC | PRN
  Administered 2020-02-20: 1 via ORAL

## 2020-02-20 MED ORDER — MIDAZOLAM HCL 5 MG/5ML IJ SOLN
INTRAMUSCULAR | Status: DC | PRN
  Administered 2020-02-20: 2 mg via INTRAVENOUS

## 2020-02-20 MED ORDER — ACETAMINOPHEN 10 MG/ML IV SOLN
1000.0000 mg | Freq: Four times a day (QID) | INTRAVENOUS | Status: DC
  Administered 2020-02-20 (×2): 1000 mg via INTRAVENOUS
  Filled 2020-02-20: qty 100

## 2020-02-20 MED ORDER — EPHEDRINE SULFATE-NACL 50-0.9 MG/10ML-% IV SOSY
PREFILLED_SYRINGE | INTRAVENOUS | Status: DC | PRN
  Administered 2020-02-20: 10 mg via INTRAVENOUS
  Administered 2020-02-20 (×2): 5 mg via INTRAVENOUS
  Administered 2020-02-20: 10 mg via INTRAVENOUS

## 2020-02-20 MED ORDER — HYDROCODONE-ACETAMINOPHEN 5-325 MG PO TABS: 1.0000 | ORAL_TABLET | Freq: Four times a day (QID) | ORAL | 0 refills | Status: AC | PRN

## 2020-02-20 MED ORDER — LACTATED RINGERS IV BOLUS
250.0000 mL | Freq: Once | INTRAVENOUS | Status: AC
  Administered 2020-02-20: 250 mL via INTRAVENOUS

## 2020-02-20 MED ORDER — PROPOFOL 1000 MG/100ML IV EMUL
INTRAVENOUS | Status: AC
  Filled 2020-02-20: qty 100

## 2020-02-20 MED ORDER — LACTATED RINGERS IV SOLN: INTRAVENOUS | Status: DC

## 2020-02-20 MED ORDER — ONDANSETRON HCL 4 MG/2ML IJ SOLN
INTRAMUSCULAR | Status: AC
  Filled 2020-02-20: qty 2

## 2020-02-20 MED ORDER — METHOCARBAMOL 500 MG PO TABS: 500.0000 mg | ORAL_TABLET | Freq: Four times a day (QID) | ORAL | 0 refills | Status: DC | PRN

## 2020-02-20 MED ORDER — RIVAROXABAN 10 MG PO TABS
10.0000 mg | ORAL_TABLET | Freq: Every day | ORAL | 0 refills | Status: DC
Start: 2020-02-20 — End: 2022-10-04

## 2020-02-20 MED ORDER — MEPIVACAINE HCL (PF) 2 % IJ SOLN
INTRAMUSCULAR | Status: AC
  Filled 2020-02-20: qty 20

## 2020-02-20 MED ORDER — PHENYLEPHRINE 40 MCG/ML (10ML) SYRINGE FOR IV PUSH (FOR BLOOD PRESSURE SUPPORT)
PREFILLED_SYRINGE | INTRAVENOUS | Status: AC
  Filled 2020-02-20: qty 10

## 2020-02-20 MED ORDER — HYDROCODONE-ACETAMINOPHEN 5-325 MG PO TABS
ORAL_TABLET | ORAL | Status: AC
  Filled 2020-02-20: qty 1

## 2020-02-20 MED ORDER — CEFAZOLIN SODIUM-DEXTROSE 2-4 GM/100ML-% IV SOLN
INTRAVENOUS | Status: AC
  Administered 2020-02-20: 2 g via INTRAVENOUS
  Filled 2020-02-20: qty 100

## 2020-02-20 MED ORDER — EPHEDRINE 5 MG/ML INJ
INTRAVENOUS | Status: AC
  Filled 2020-02-20: qty 10

## 2020-02-20 MED ORDER — METHOCARBAMOL 500 MG IVPB - SIMPLE MED
500.0000 mg | Freq: Four times a day (QID) | INTRAVENOUS | Status: DC | PRN
  Administered 2020-02-20: 500 mg via INTRAVENOUS

## 2020-02-20 MED ORDER — PROPOFOL 500 MG/50ML IV EMUL
INTRAVENOUS | Status: DC | PRN
  Administered 2020-02-20: 40 ug/kg/min via INTRAVENOUS

## 2020-02-20 MED ORDER — MEPIVACAINE HCL (PF) 2 % IJ SOLN
INTRAMUSCULAR | Status: DC | PRN
  Administered 2020-02-20: 3 mL via INTRATHECAL

## 2020-02-20 MED ORDER — CHLORHEXIDINE GLUCONATE 4 % EX LIQD: 60.0000 mL | Freq: Once | CUTANEOUS | Status: DC

## 2020-02-20 MED ORDER — METHOCARBAMOL 500 MG IVPB - SIMPLE MED
INTRAVENOUS | Status: AC
  Filled 2020-02-20: qty 50

## 2020-02-20 MED ORDER — FENTANYL CITRATE (PF) 100 MCG/2ML IJ SOLN
25.0000 ug | INTRAMUSCULAR | Status: DC | PRN
  Administered 2020-02-20 (×2): 25 ug via INTRAVENOUS

## 2020-02-20 MED ORDER — METHOCARBAMOL 500 MG PO TABS: 500.0000 mg | ORAL_TABLET | Freq: Four times a day (QID) | ORAL | Status: DC | PRN

## 2020-02-20 MED ORDER — FENTANYL CITRATE (PF) 100 MCG/2ML IJ SOLN
INTRAMUSCULAR | Status: AC
  Administered 2020-02-20: 50 ug via INTRAVENOUS
  Filled 2020-02-20: qty 2

## 2020-02-20 MED ORDER — DEXAMETHASONE SODIUM PHOSPHATE 10 MG/ML IJ SOLN
INTRAMUSCULAR | Status: AC
  Filled 2020-02-20: qty 1

## 2020-02-20 MED ORDER — ONDANSETRON HCL 4 MG/2ML IJ SOLN
INTRAMUSCULAR | Status: DC | PRN
  Administered 2020-02-20: 4 mg via INTRAVENOUS

## 2020-02-20 MED ORDER — TRAMADOL HCL 50 MG PO TABS: 50.0000 mg | ORAL_TABLET | Freq: Four times a day (QID) | ORAL | 0 refills | Status: AC | PRN

## 2020-02-20 MED ORDER — WATER FOR IRRIGATION, STERILE IR SOLN
Status: DC | PRN
  Administered 2020-02-20: 2000 mL

## 2020-02-20 MED ORDER — CHLORHEXIDINE GLUCONATE 0.12 % MT SOLN
15.0000 mL | Freq: Once | OROMUCOSAL | Status: AC
  Administered 2020-02-20: 15 mL via OROMUCOSAL

## 2020-02-20 MED ORDER — DEXAMETHASONE SODIUM PHOSPHATE 10 MG/ML IJ SOLN
8.0000 mg | Freq: Once | INTRAMUSCULAR | Status: AC
  Administered 2020-02-20: 8 mg via INTRAVENOUS

## 2020-02-20 MED ORDER — LACTATED RINGERS IV BOLUS
500.0000 mL | Freq: Once | INTRAVENOUS | Status: AC
  Administered 2020-02-20: 500 mL via INTRAVENOUS

## 2020-02-20 MED ORDER — ACETAMINOPHEN 10 MG/ML IV SOLN
INTRAVENOUS | Status: AC
  Filled 2020-02-20: qty 100

## 2020-02-20 MED ORDER — 0.9 % SODIUM CHLORIDE (POUR BTL) OPTIME
TOPICAL | Status: DC | PRN
  Administered 2020-02-20: 1000 mL

## 2020-02-20 MED ORDER — MIDAZOLAM HCL 2 MG/2ML IJ SOLN
INTRAMUSCULAR | Status: AC
  Filled 2020-02-20: qty 2

## 2020-02-20 SURGICAL SUPPLY — 50 items
BAG DECANTER FOR FLEXI CONT (MISCELLANEOUS) IMPLANT
BAG ZIPLOCK 12X15 (MISCELLANEOUS) IMPLANT
BLADE SAG 18X100X1.27 (BLADE) ×3 IMPLANT
CLOSURE STERI-STRIP 1/2X4 (GAUZE/BANDAGES/DRESSINGS) ×2
CLOSURE WOUND 1/2 X4 (GAUZE/BANDAGES/DRESSINGS) ×1
CLSR STERI-STRIP ANTIMIC 1/2X4 (GAUZE/BANDAGES/DRESSINGS) ×4 IMPLANT
COVER PERINEAL POST (MISCELLANEOUS) ×3 IMPLANT
COVER SURGICAL LIGHT HANDLE (MISCELLANEOUS) ×3 IMPLANT
COVER WAND RF STERILE (DRAPES) ×3 IMPLANT
CUP ACET PINNACLE SECTR 50MM (Hips) ×1 IMPLANT
DECANTER SPIKE VIAL GLASS SM (MISCELLANEOUS) ×3 IMPLANT
DRAPE STERI IOBAN 125X83 (DRAPES) ×3 IMPLANT
DRAPE U-SHAPE 47X51 STRL (DRAPES) ×6 IMPLANT
DRESSING AQUACEL AG SP 3.5X10 (GAUZE/BANDAGES/DRESSINGS) ×1 IMPLANT
DRSG ADAPTIC 3X8 NADH LF (GAUZE/BANDAGES/DRESSINGS) ×3 IMPLANT
DRSG AQUACEL AG ADV 3.5X10 (GAUZE/BANDAGES/DRESSINGS) ×3 IMPLANT
DRSG AQUACEL AG SP 3.5X10 (GAUZE/BANDAGES/DRESSINGS) ×3
DURAPREP 26ML APPLICATOR (WOUND CARE) ×3 IMPLANT
ELECT REM PT RETURN 15FT ADLT (MISCELLANEOUS) ×3 IMPLANT
EVACUATOR 1/8 PVC DRAIN (DRAIN) IMPLANT
GLOVE BIO SURGEON STRL SZ 6 (GLOVE) IMPLANT
GLOVE BIO SURGEON STRL SZ7 (GLOVE) IMPLANT
GLOVE BIO SURGEON STRL SZ8 (GLOVE) ×3 IMPLANT
GLOVE BIOGEL PI IND STRL 6.5 (GLOVE) IMPLANT
GLOVE BIOGEL PI IND STRL 7.0 (GLOVE) IMPLANT
GLOVE BIOGEL PI IND STRL 8 (GLOVE) ×1 IMPLANT
GLOVE BIOGEL PI INDICATOR 6.5 (GLOVE)
GLOVE BIOGEL PI INDICATOR 7.0 (GLOVE)
GLOVE BIOGEL PI INDICATOR 8 (GLOVE) ×2
GOWN STRL REUS W/TWL LRG LVL3 (GOWN DISPOSABLE) ×3 IMPLANT
GOWN STRL REUS W/TWL XL LVL3 (GOWN DISPOSABLE) IMPLANT
HEAD FEMORAL 32 CERAMIC (Hips) ×3 IMPLANT
HOLDER FOLEY CATH W/STRAP (MISCELLANEOUS) ×3 IMPLANT
KIT TURNOVER KIT A (KITS) IMPLANT
LINER MARATHON 32 50 (Hips) ×3 IMPLANT
MANIFOLD NEPTUNE II (INSTRUMENTS) ×3 IMPLANT
PACK ANTERIOR HIP CUSTOM (KITS) ×3 IMPLANT
PENCIL SMOKE EVACUATOR COATED (MISCELLANEOUS) ×3 IMPLANT
PINNACLE SECTOR CUP 50MM (Hips) ×3 IMPLANT
STEM FEMORAL SZ 6MM STD ACTIS (Stem) ×3 IMPLANT
STRIP CLOSURE SKIN 1/2X4 (GAUZE/BANDAGES/DRESSINGS) ×2 IMPLANT
SUT ETHIBOND NAB CT1 #1 30IN (SUTURE) ×3 IMPLANT
SUT MNCRL AB 4-0 PS2 18 (SUTURE) ×3 IMPLANT
SUT STRATAFIX 0 PDS 27 VIOLET (SUTURE) ×3
SUT VIC AB 2-0 CT1 27 (SUTURE) ×4
SUT VIC AB 2-0 CT1 TAPERPNT 27 (SUTURE) ×2 IMPLANT
SUTURE STRATFX 0 PDS 27 VIOLET (SUTURE) ×1 IMPLANT
SYR 50ML LL SCALE MARK (SYRINGE) IMPLANT
TRAY FOLEY MTR SLVR 16FR STAT (SET/KITS/TRAYS/PACK) ×3 IMPLANT
YANKAUER SUCT BULB TIP 10FT TU (MISCELLANEOUS) ×3 IMPLANT

## 2020-02-20 NOTE — Discharge Instructions (Addendum)
Julia Gross, MD Total Joint Specialist [facility] [address] [place], [place] [address] [phone]       ANTERIOR APPROACH TOTAL HIP REPLACEMENT POSTOPERATIVE DIRECTIONS FOR SAME DAY DISCHARGE    Hip Rehabilitation, Guidelines Following Surgery  The results of a hip operation are greatly improved after range of motion and muscle strengthening exercises. Follow all safety measures which are given to protect your hip. If any of these exercises cause increased pain or swelling in your joint, decrease the amount until you are comfortable again. Then slowly increase the exercises. Call your caregiver if you have problems or questions.   BLOOD CLOT PREVENTION . Take a 10 mg Xarelto once a day for three weeks following surgery. Then take an 81 mg Aspirin once a day for three weeks. Then discontinue Aspirin. . You may resume your vitamins/supplements once you have discontinued the Xarelto. . Do not take any NSAIDs (Advil, Aleve, Ibuprofen, Meloxicam, etc.) until you have discontinued the Xarelto.   HOME CARE INSTRUCTIONS  . Remove items at home which could result in a fall. This includes throw rugs or furniture in walking pathways.   ICE to the affected hip as frequently as 20-30 minutes an hour and then as needed for pain and swelling.  Continue to use ice on the hip for pain and swelling from surgery. You may notice swelling that will progress down to the foot and ankle.  This is normal after surgery.  Elevate the leg when you are not up walking on it.    Continue to use the breathing machine which will help keep your temperature down.  It is common for your temperature to cycle up and down following surgery, especially at night when you are not up moving around and exerting yourself.  The breathing machine keeps your lungs expanded and your temperature down.  DIET You may resume your previous home diet once you are discharged from the  hospital.  DRESSING / WOUND CARE / SHOWERING . You have an adhesive waterproof bandage over the incision. Leave this in place until your first follow-up appointment. Once you remove this you will not need to place another bandage.  . You may begin showering 3 days following surgery, but do not submerge the incision under water.  ACTIVITY . For the first 3-5 days it is important to rest and keep the operative leg elevated. You should, as a general rule, rest for 50 minutes per hour and get up and walk/stretch for 10 minutes per hour. After 5 days you can slowly increase activity as tolerated. Marland Kitchen Perform the exercises you were provided twice a day for about 15-20 minutes each session. Begin these 2 days after surgery.  WEIGHT BEARING . You may bear weight as tolerated on your operative leg using a walker for assistance . Walk with your walker as instructed. Use the walker until you are comfortable transitioning to a cane. Walk with the cane in the opposite hand of the operative leg. You may discontinue the cane once you are comfortable and walking steadily.  POSTOPERATIVE CONSTIPATION PROTOCOL Constipation - defined medically as fewer than three stools per week and severe constipation as less than one stool per week.  One of the most common issues patients have following surgery is constipation.  Even if you have a regular bowel pattern at home, your normal regimen is likely to be disrupted due to multiple reasons following surgery.  Combination of anesthesia, postoperative narcotics, change in appetite  and fluid intake all can affect your bowels.  In order to avoid complications following surgery, here are some recommendations in order to help you during your recovery period.  Colace (docusate) - Pick up an over-the-counter form of Colace or another stool softener and take twice a day as long as you are requiring postoperative pain medications.  Take with a full glass of water daily.  If you  experience loose stools or diarrhea, hold the colace until you stool forms back up.  If your symptoms do not get better within 1 week or if they get worse, check with your doctor  MiraLax (polyethylene glycol) - Pick up over-the-counter to have on hand.  MiraLax is a solution that will increase the amount of water in your bowels to assist with bowel movements.  Take as directed and can mix with a glass of water, juice, soda, coffee, or tea.  Take if you go more than two days without a movement. Do not use MiraLax more than once per day. Call your doctor if you are still constipated or irregular after using this medication for 5 days in a row.  If you continue to have problems with postoperative constipation, please contact the office for further assistance and recommendations.  If you experience "the worst abdominal pain ever" or develop nausea or vomiting, please contact the office immediatly for further recommendations for treatment.  ITCHING  If you experience itching with your medications, try taking only a single pain pill, or even half a pain pill at a time.  You can also use Benadryl over the counter for itching or also to help with sleep.   TED HOSE STOCKINGS Wear the elastic stockings on both legs for three weeks following surgery during the day but you may remove then at night for sleeping.  MEDICATIONS See your medication summary on the "After Visit Summary" that the nursing staff will review with you prior to discharge.  You may have some home medications which will be placed on hold until you complete the course of blood thinner medication.  It is important for you to complete the blood thinner medication as prescribed by your surgeon.  Continue your approved medications as instructed at time of discharge.  PRECAUTIONS If you experience chest pain or shortness of breath - call 911 immediately for transfer to the hospital emergency department.  If you develop a fever greater that 101 F,  purulent drainage from wound, increased redness or drainage from wound, foul odor from the wound/dressing, or calf pain - CONTACT YOUR SURGEON.                                                   FOLLOW-UP APPOINTMENTS Make sure you keep all of your appointments after your operation with your surgeon and caregivers. You should call the office at the above phone number and make an appointment for approximately two weeks after the date of your surgery or on the date instructed by your surgeon outlined in the "After Visit Summary".  MAKE SURE YOU:  . Understand these instructions.  . Get help right away if you are not doing well or get worse.   DENTAL ANTIBIOTICS:  In most cases prophylactic antibiotics for Dental procdeures after total joint surgery are not necessary.  Exceptions are as follows:  1. History of prior total joint infection  2. Severely immunocompromised (Organ Transplant, cancer chemotherapy, Rheumatoid biologic meds such as Humera)  3. Poorly controlled diabetes (A1C &gt; 8.0, blood glucose over 200)  If you have one of these conditions, contact your surgeon for an antibiotic prescription, prior to your dental procedure.   Pick up stool softner and laxative for home use following surgery while on pain medications. May shower starting three days after surgery. Continue to use ice for pain and swelling after surgery. Do not use any lotions or creams on the incision until instructed by your surgeon.

## 2020-02-20 NOTE — Evaluation (Addendum)
Physical Therapy Evaluation Patient Details Name: Julia Hess MRN: [id number] DOB: 12-26-1945 Today's Date: 02/20/2020   History of Present Illness  Patient is 74 y.o. female s/p Rt THA anterior approach on 02/20/20 with PMH significant for HTN, HLD, OA, hypothyroidism.    Clinical Impression  Julia Hess is a 73 y.o.  Female POD 0 s/p Rt THA. Patient reports independence with mobility at baseline. Patient is now limited by functional impairments (see PT problem list below) and requires supervision/min guard for transfers and gait with RW. Patient was able to ambulate ~100 feet with RW and min guard/supervision and cues for safe walker management. Patient educated on safe sequencing for stair mobility and verbalized safe guarding position for people assisting with mobility. Following gait and stair mobility pt reported some overheated sensation and denied all other symptoms of hypotension (dizziness, lightheadedness, nausea). BP assessed and found to be 88/61 sitting EOB.  After return to supine pt BP improved to 111/74 and patient instructed in exercises to facilitate ROM and circulation. Patient will benefit from continued skilled PT interventions to address impairments and progress towards PLOF. Patient has met mobility goals at adequate level for discharge home however expressed concern to RN regarding hypotension with mobility and suggest contacting MD to review medical stability and activity tolerance in regards to safety with discharge home. Acute PTwill continue to follow if pt continues acute stay to progress towards Mod I goals.   VITALS With Mobility TIME                       Position 1300       103/61      Supine 1304       115/72      Sitting 1329       88/61        Sitting after gait 1335       111/71      Supine    Follow Up Recommendations Follow surgeon's recommendation for DC plan and follow-up therapies    Equipment Recommendations  Rolling walker with 5" wheels;3in1 (PT)     Recommendations for Other Services       Precautions / Restrictions Precautions Precautions: Fall Restrictions Weight Bearing Restrictions: No Other Position/Activity Restrictions: WBAT      Mobility  Bed Mobility Overal bed mobility: Modified Independent Bed Mobility: Supine to Sit;Sit to Supine     Supine to sit: Modified independent (Device/Increase time) Sit to supine: Modified independent (Device/Increase time)   General bed mobility comments: Mod if from flat bed,   Transfers Overall transfer level: Needs assistance Equipment used: Rolling walker (2 wheeled) Transfers: Sit to/from Stand Sit to Stand: Min guard;Supervision         General transfer comment: cues for technique with RW, no overt LOB noted. pt steady with rising from EOB and toilet.   Ambulation/Gait Ambulation/Gait assistance: Min guard;Supervision Gait Distance (Feet): 100 Feet Assistive device: Rolling walker (2 wheeled) Gait Pattern/deviations: Step-to pattern;Step-through pattern;Decreased stride length Gait velocity: decreased   General Gait Details: cues for safe   Stairs Stairs: Yes Stairs assistance: Min guard Stair Management: No rails Number of Stairs: 4(2x2)    Wheelchair Mobility    Modified Rankin (Stroke Patients Only)       Balance Overall balance assessment: Needs assistance Sitting-balance support: Feet supported Sitting balance-Leahy Scale: Normal     Standing balance support: During functional activity;Bilateral upper extremity supported Standing balance-Leahy Scale: Fair Standing balance comment: can maintain static stand  for washing hands              Pertinent Vitals/Pain Pain Assessment: 0-10 Pain Score: 3  Pain Location: Rt hip Pain Descriptors / Indicators: Burning;Aching;Discomfort Pain Intervention(s): Limited activity within patient's tolerance;Monitored during session;Repositioned;Premedicated before session    Home Living  Family/patient expects to be discharged to:: Private residence Living Arrangements: Spouse/significant other;Children Available Help at Discharge: Family Type of Home: House Home Access: Stairs to enter Entrance Stairs-Rails: None Entrance Stairs-Number of Steps: 2 Home Layout: One level Home Equipment: Cane - single point;Toilet riser      Prior Function Level of Independence: Independent         Comments: semi-retired psychiatric therapist     Hand Dominance   Dominant Hand: Right    Extremity/Trunk Assessment   Upper Extremity Assessment Upper Extremity Assessment: Overall WFL for tasks assessed    Lower Extremity Assessment Lower Extremity Assessment: Overall WFL for tasks assessed    Cervical / Trunk Assessment Cervical / Trunk Assessment: Normal  Communication   Communication: No difficulties  Cognition Arousal/Alertness: Awake/alert Behavior During Therapy: WFL for tasks assessed/performed Overall Cognitive Status: Within Functional Limits for tasks assessed               General Comments      Exercises Total Joint Exercises Ankle Circles/Pumps: AROM;Both;20 reps;Supine Quad Sets: AROM;Right;5 reps;Supine Short Arc Quad: AROM;Right;5 reps;Supine Heel Slides: AROM;Right;5 reps;Supine Hip ABduction/ADduction: AROM;Right;5 reps;Supine Long Arc Quad: AROM;Right;5 reps;Seated   Assessment/Plan    PT Assessment Patient needs continued PT services  PT Problem List Decreased strength;Decreased range of motion;Decreased activity tolerance;Decreased balance;Decreased mobility;Decreased knowledge of use of DME;Decreased knowledge of precautions       PT Treatment Interventions DME instruction;Gait training;Stair training;Functional mobility training;Therapeutic activities;Therapeutic exercise;Balance training;Patient/family education    PT Goals (Current goals can be found in the Care Plan section)  Acute Rehab PT Goals Patient Stated Goal: to go  home, get back to gardneing, traveling, and classes at the gym. PT Goal Formulation: With patient Time For Goal Achievement: 02/24/20 Potential to Achieve Goals: Good    Frequency 7X/week    AM-PAC PT "6 Clicks" Mobility  Outcome Measure Help needed turning from your back to your side while in a flat bed without using bedrails?: None Help needed moving from lying on your back to sitting on the side of a flat bed without using bedrails?: None Help needed moving to and from a bed to a chair (including a wheelchair)?: A Little Help needed standing up from a chair using your arms (e.g., wheelchair or bedside chair)?: A Little Help needed to walk in hospital room?: A Little Help needed climbing 3-5 steps with a railing? : A Little 6 Click Score: 20    End of Session Equipment Utilized During Treatment: Gait belt Activity Tolerance: Patient tolerated treatment well Patient left: in bed;with call bell/phone within reach Nurse Communication: Mobility status PT Visit Diagnosis: Muscle weakness (generalized) (M62.81);Difficulty in walking, not elsewhere classified (R26.2)    Time: 4403-4742 PT Time Calculation (min) (ACUTE ONLY): 50 min   Charges:   PT Evaluation $PT Eval Low Complexity: 1 Low PT Treatments $Gait Training: 8-22 mins $Therapeutic Exercise: 8-22 mins       Verner Mould, DPT Physical Therapist with [facility] [phone]  02/20/2020 2:15 PM

## 2020-02-20 NOTE — Anesthesia Procedure Notes (Signed)
Spinal  Start time: 02/20/2020 7:03 AM End time: 02/20/2020 7:08 AM Staffing Resident/CRNA: Victoriano Lain, CRNA Preanesthetic Checklist Completed: patient identified, IV checked, site marked, risks and benefits discussed, surgical consent, monitors and equipment checked, pre-op evaluation and timeout performed Spinal Block Patient position: sitting Prep: DuraPrep and site prepped and draped Patient monitoring: heart rate, continuous pulse ox and blood pressure Approach: midline Location: L3-4 Injection technique: single-shot Needle Needle type: Pencan  Needle gauge: 24 G Needle length: 10 cm Assessment Sensory level: T4 Additional Notes Pt placed in sitting position for spinal placement. Spinal kit expiration date checked and verified. Clear CSF obtained. - heme.  Pt tolerated well.

## 2020-02-20 NOTE — Anesthesia Postprocedure Evaluation (Signed)
Anesthesia Post Note  Patient: Julia Hess  Procedure(s) Performed: TOTAL HIP ARTHROPLASTY ANTERIOR APPROACH (Right Hip)     Patient location during evaluation: PACU Anesthesia Type: Spinal Level of consciousness: oriented, awake and alert and awake Pain management: pain level controlled Vital Signs Assessment: post-procedure vital signs reviewed and stable Respiratory status: spontaneous breathing, respiratory function stable, patient connected to nasal cannula oxygen and nonlabored ventilation Cardiovascular status: blood pressure returned to baseline and stable Postop Assessment: no headache, no backache, no apparent nausea or vomiting, patient able to bend at knees and spinal receding Anesthetic complications: no    Last Vitals:  Vitals:   02/20/20 1533 02/20/20 1545  BP: 132/79 138/78  Pulse: 82 89  Resp:    Temp:    SpO2:  97%    Last Pain:  Vitals:   02/20/20 1545  TempSrc:   PainSc: 1                  Cecile Hearing

## 2020-02-20 NOTE — Interval H&P Note (Signed)
History and Physical Interval Note:  02/20/2020 6:43 AM  Julia Hess  has presented today for surgery, with the diagnosis of right hip osteoarthritits.  The various methods of treatment have been discussed with the patient and family. After consideration of risks, benefits and other options for treatment, the patient has consented to  Procedure(s) with comments: TOTAL HIP ARTHROPLASTY ANTERIOR APPROACH (Right) - as a surgical intervention.  The patient's history has been reviewed, patient examined, no change in status, stable for surgery.  I have reviewed the patient's chart and labs.  Questions were answered to the patient's satisfaction.     Homero Fellers Oshen Wlodarczyk

## 2020-02-20 NOTE — Op Note (Signed)
OPERATIVE REPORT- TOTAL HIP ARTHROPLASTY   PREOPERATIVE DIAGNOSIS: Osteoarthritis of the Right hip.   POSTOPERATIVE DIAGNOSIS: Osteoarthritis of the Right  hip.   PROCEDURE: Right total hip arthroplasty, anterior approach.   SURGEON: Gaynelle Arabian, MD   ASSISTANT: Griffith Citron, PA-C  ANESTHESIA:  Spinal  ESTIMATED BLOOD LOSS:-200 mL    DRAINS: Hemovac x1.   COMPLICATIONS: None   CONDITION: PACU - hemodynamically stable.   BRIEF CLINICAL NOTE: Julia Hess is a 74 y.o. female who has advanced end-  stage arthritis of their Right  hip with progressively worsening pain and  dysfunction.The patient has failed nonoperative management and presents for  total hip arthroplasty.   PROCEDURE IN DETAIL: After successful administration of spinal  anesthetic, the traction boots for the [facility] bed were placed on both  feet and the [facility] bed, boots placed into the leg  holders. The Right hip was then isolated from the perineum with plastic  drapes and prepped and draped in the usual sterile fashion. ASIS and  greater trochanter were marked and a oblique incision was made, starting  at about 1 cm lateral and 2 cm distal to the ASIS and coursing towards  the anterior cortex of the femur. The skin was cut with a 10 blade  through subcutaneous tissue to the level of the fascia overlying the  tensor fascia lata muscle. The fascia was then incised in line with the  incision at the junction of the anterior third and posterior 2/3rd. The  muscle was teased off the fascia and then the interval between the TFL  and the rectus was developed. The Hohmann retractor was then placed at  the top of the femoral neck over the capsule. The vessels overlying the  capsule were cauterized and the fat on top of the capsule was removed.  A Hohmann retractor was then placed anterior underneath the rectus  femoris to give exposure to the entire anterior capsule. A T-shaped   capsulotomy was performed. The edges were tagged and the femoral head  was identified.       Osteophytes are removed off the superior acetabulum.  The femoral neck was then cut in situ with an oscillating saw. Traction  was then applied to the left lower extremity utilizing the [facility]  traction. The femoral head was then removed. Retractors were placed  around the acetabulum and then circumferential removal of the labrum was  performed. Osteophytes were also removed. Reaming starts at 47 mm to  medialize and  Increased in 2 mm increments to 49 mm. We reamed in  approximately 40 degrees of abduction, 20 degrees anteversion. A 50 mm  pinnacle acetabular shell was then impacted in anatomic position under  fluoroscopic guidance with excellent purchase. We did not need to place  any additional dome screws. A 32 mm neutral + 4 marathon liner was then  placed into the acetabular shell.       The femoral lift was then placed along the lateral aspect of the femur  just distal to the vastus ridge. The leg was  externally rotated and capsule  was stripped off the inferior aspect of the femoral neck down to the  level of the lesser trochanter, this was done with electrocautery. The femur was lifted after this was performed. The  leg was then placed in an extended and adducted position essentially delivering the femur. We also removed the capsule superiorly and the piriformis from the piriformis fossa  to gain excellent exposure of the  proximal femur. Rongeur was used to remove some cancellous bone to get  into the lateral portion of the proximal femur for placement of the  initial starter reamer. The starter broaches was placed  the starter broach  and was shown to go down the center of the canal. Broaching  with the Actis system was then performed starting at size 0  coursing  Up to size 6. A size 6 had excellent torsional and rotational  and axial stability. The trial standard offset neck was then  placed  with a 32 + 1 trial head. The hip was then reduced. We confirmed that  the stem was in the canal both on AP and lateral x-rays. It also has excellent sizing. The hip was reduced with outstanding stability through full extension and full external rotation.. AP pelvis was taken and the leg lengths were measured and found to be equal. Hip was then dislocated again and the femoral head and neck removed. The  femoral broach was removed. Size 6 Actis stem with a standard offset  neck was then impacted into the femur following native anteversion. Has  excellent purchase in the canal. Excellent torsional and rotational and  axial stability. It is confirmed to be in the canal on AP and lateral  fluoroscopic views. The 32 + 1 ceramic head was placed and the hip  reduced with outstanding stability. Again AP pelvis was taken and it  confirmed that the leg lengths were equal. The wound was then copiously  irrigated with saline solution and the capsule reattached and repaired  with Ethibond suture. 30 ml of .25% Bupivicaine was  injected into the capsule and into the edge of the tensor fascia lata as well as subcutaneous tissue. The fascia overlying the tensor fascia lata was then closed with a running #1 V-Loc. Subcu was closed with interrupted 2-0 Vicryl and subcuticular running 4-0 Monocryl. Incision was cleaned  and dried. Steri-Strips and a bulky sterile dressing applied. Hemovac  drain was hooked to suction and then the patient was awakened and transported to  recovery in stable condition.        Please note that a surgical assistant was a medical necessity for this procedure to perform it in a safe and expeditious manner. Assistant was necessary to provide appropriate retraction of vital neurovascular structures and to prevent femoral fracture and allow for anatomic placement of the prosthesis.  Ollen Gross, M.D.

## 2020-02-20 NOTE — Transfer of Care (Signed)
Immediate Anesthesia Transfer of Care Note  Patient: Constantina Laseter  Procedure(s) Performed: TOTAL HIP ARTHROPLASTY ANTERIOR APPROACH (Right Hip)  Patient Location: PACU  Anesthesia Type:MAC and Spinal  Level of Consciousness: awake, alert , oriented and patient cooperative  Airway & Oxygen Therapy: Patient Spontanous Breathing and Patient connected to face mask oxygen  Post-op Assessment: Report given to RN and Post -op Vital signs reviewed and stable  Post vital signs: Reviewed and stable  Last Vitals:  Vitals Value Taken Time  BP 121/67 02/20/20 0830  Temp    Pulse 86 02/20/20 0831  Resp    SpO2 100 % 02/20/20 0831  Vitals shown include unvalidated device data.  Last Pain:  Vitals:   02/20/20 0553  TempSrc:   PainSc: 0-No pain      Patients Stated Pain Goal: 3 (15/94/58 [id number])  Complications: No apparent anesthesia complications

## 2020-02-21 ENCOUNTER — Encounter: Payer: Self-pay | Admitting: *Deleted

## 2020-03-12 DIAGNOSIS — F4322 Adjustment disorder with anxiety: Secondary | ICD-10-CM | POA: Diagnosis not present

## 2020-03-25 DIAGNOSIS — Z96641 Presence of right artificial hip joint: Secondary | ICD-10-CM | POA: Diagnosis not present

## 2020-03-26 DIAGNOSIS — F4322 Adjustment disorder with anxiety: Secondary | ICD-10-CM | POA: Diagnosis not present

## 2020-04-09 DIAGNOSIS — F4322 Adjustment disorder with anxiety: Secondary | ICD-10-CM | POA: Diagnosis not present

## 2020-05-07 DIAGNOSIS — F4322 Adjustment disorder with anxiety: Secondary | ICD-10-CM | POA: Diagnosis not present

## 2020-05-21 DIAGNOSIS — F4322 Adjustment disorder with anxiety: Secondary | ICD-10-CM | POA: Diagnosis not present

## 2020-06-05 DIAGNOSIS — I10 Essential (primary) hypertension: Secondary | ICD-10-CM | POA: Diagnosis not present

## 2020-06-05 DIAGNOSIS — M810 Age-related osteoporosis without current pathological fracture: Secondary | ICD-10-CM | POA: Diagnosis not present

## 2020-06-05 DIAGNOSIS — E039 Hypothyroidism, unspecified: Secondary | ICD-10-CM | POA: Diagnosis not present

## 2020-06-05 DIAGNOSIS — Z79899 Other long term (current) drug therapy: Secondary | ICD-10-CM | POA: Diagnosis not present

## 2020-06-05 DIAGNOSIS — E782 Mixed hyperlipidemia: Secondary | ICD-10-CM | POA: Diagnosis not present

## 2020-06-05 DIAGNOSIS — Z6822 Body mass index (BMI) 22.0-22.9, adult: Secondary | ICD-10-CM | POA: Diagnosis not present

## 2020-06-12 DIAGNOSIS — Z20828 Contact with and (suspected) exposure to other viral communicable diseases: Secondary | ICD-10-CM | POA: Diagnosis not present

## 2020-06-12 DIAGNOSIS — Z20822 Contact with and (suspected) exposure to covid-19: Secondary | ICD-10-CM | POA: Diagnosis not present

## 2020-06-13 DIAGNOSIS — Z20828 Contact with and (suspected) exposure to other viral communicable diseases: Secondary | ICD-10-CM | POA: Diagnosis not present

## 2020-06-18 DIAGNOSIS — F4322 Adjustment disorder with anxiety: Secondary | ICD-10-CM | POA: Diagnosis not present

## 2020-07-01 DIAGNOSIS — L218 Other seborrheic dermatitis: Secondary | ICD-10-CM | POA: Diagnosis not present

## 2020-07-01 DIAGNOSIS — L821 Other seborrheic keratosis: Secondary | ICD-10-CM | POA: Diagnosis not present

## 2020-07-01 DIAGNOSIS — L814 Other melanin hyperpigmentation: Secondary | ICD-10-CM | POA: Diagnosis not present

## 2020-07-01 DIAGNOSIS — D1801 Hemangioma of skin and subcutaneous tissue: Secondary | ICD-10-CM | POA: Diagnosis not present

## 2020-07-01 DIAGNOSIS — D2272 Melanocytic nevi of left lower limb, including hip: Secondary | ICD-10-CM | POA: Diagnosis not present

## 2020-07-02 DIAGNOSIS — F4322 Adjustment disorder with anxiety: Secondary | ICD-10-CM | POA: Diagnosis not present

## 2020-07-16 DIAGNOSIS — F4322 Adjustment disorder with anxiety: Secondary | ICD-10-CM | POA: Diagnosis not present

## 2020-07-30 DIAGNOSIS — F4322 Adjustment disorder with anxiety: Secondary | ICD-10-CM | POA: Diagnosis not present

## 2020-08-13 DIAGNOSIS — F4322 Adjustment disorder with anxiety: Secondary | ICD-10-CM | POA: Diagnosis not present

## 2020-08-25 DIAGNOSIS — H04123 Dry eye syndrome of bilateral lacrimal glands: Secondary | ICD-10-CM | POA: Diagnosis not present

## 2020-08-27 DIAGNOSIS — F4322 Adjustment disorder with anxiety: Secondary | ICD-10-CM | POA: Diagnosis not present

## 2020-09-10 DIAGNOSIS — F4322 Adjustment disorder with anxiety: Secondary | ICD-10-CM | POA: Diagnosis not present

## 2020-09-24 DIAGNOSIS — F4322 Adjustment disorder with anxiety: Secondary | ICD-10-CM | POA: Diagnosis not present

## 2020-09-28 IMAGING — RF DG HIP (WITH PELVIS) OPERATIVE*R*
1 series · 2 of 2 positions shown · non-contrast
Comparison: None.

CLINICAL DATA: Status post right hip replacement.

EXAM:
OPERATIVE right HIP (WITH PELVIS IF PERFORMED) 2 VIEWS
TECHNIQUE: Fluoroscopic spot image(s) were submitted for interpretation
post-operatively.
Radiation exposure index: 0.7496 mGy.

[Series 1: unknown protocol · 0.20mm/px · 2 of 2 slices shown]
[im 1/2]
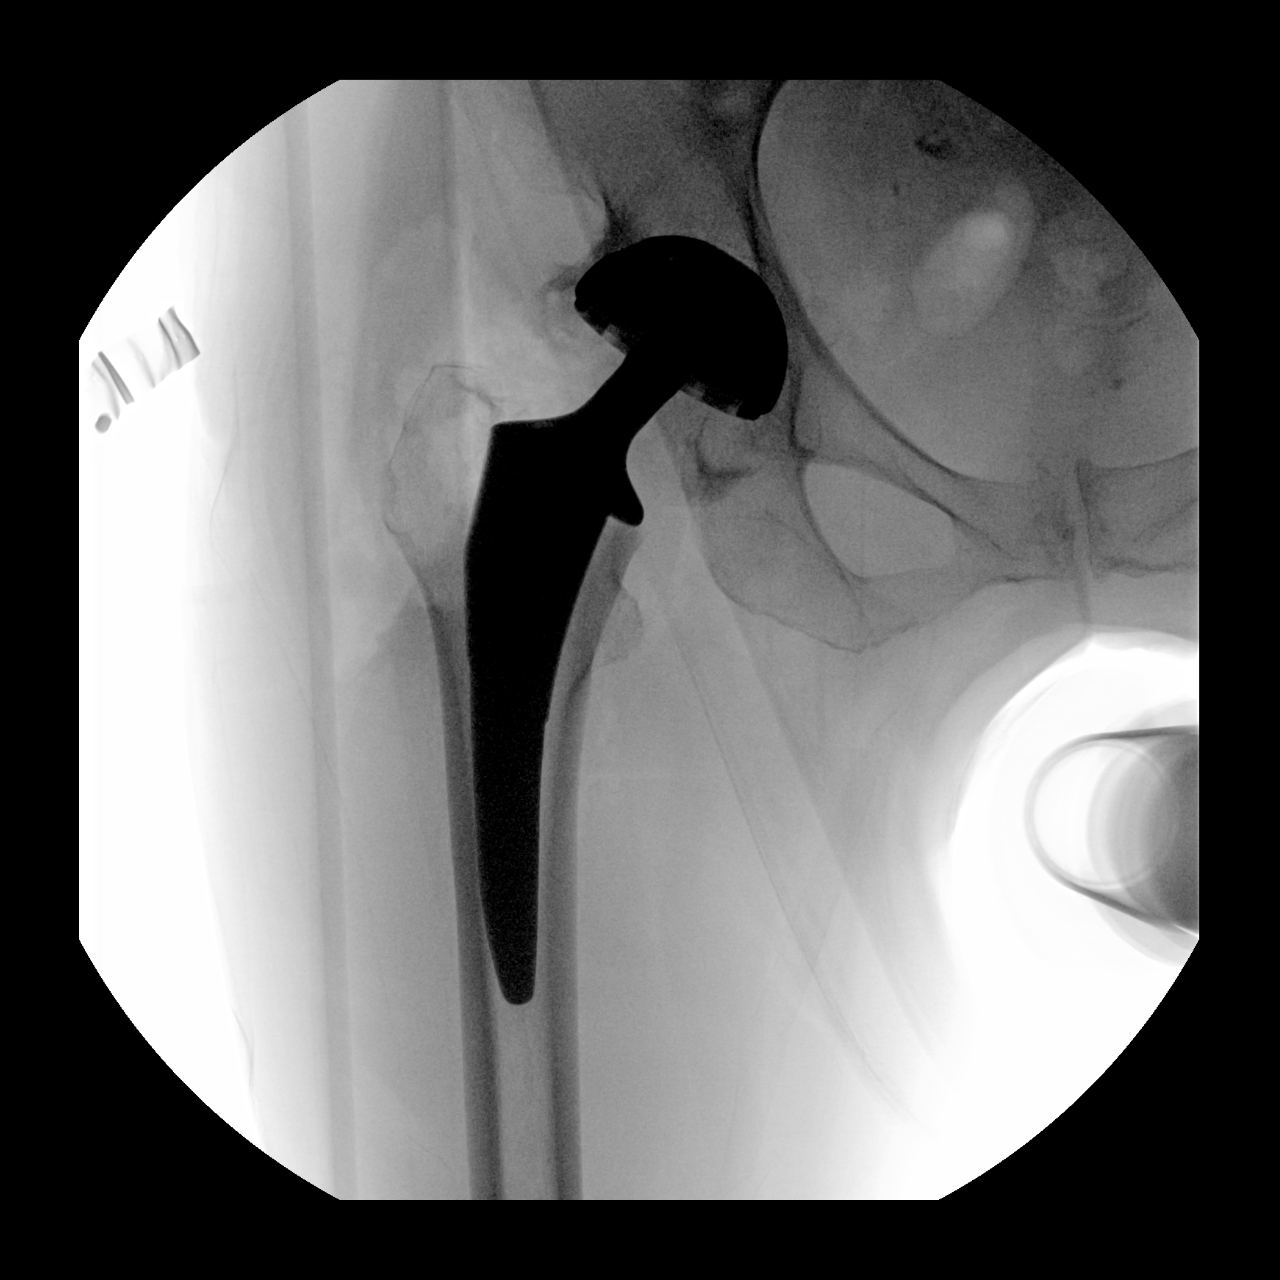
[im 2/2]
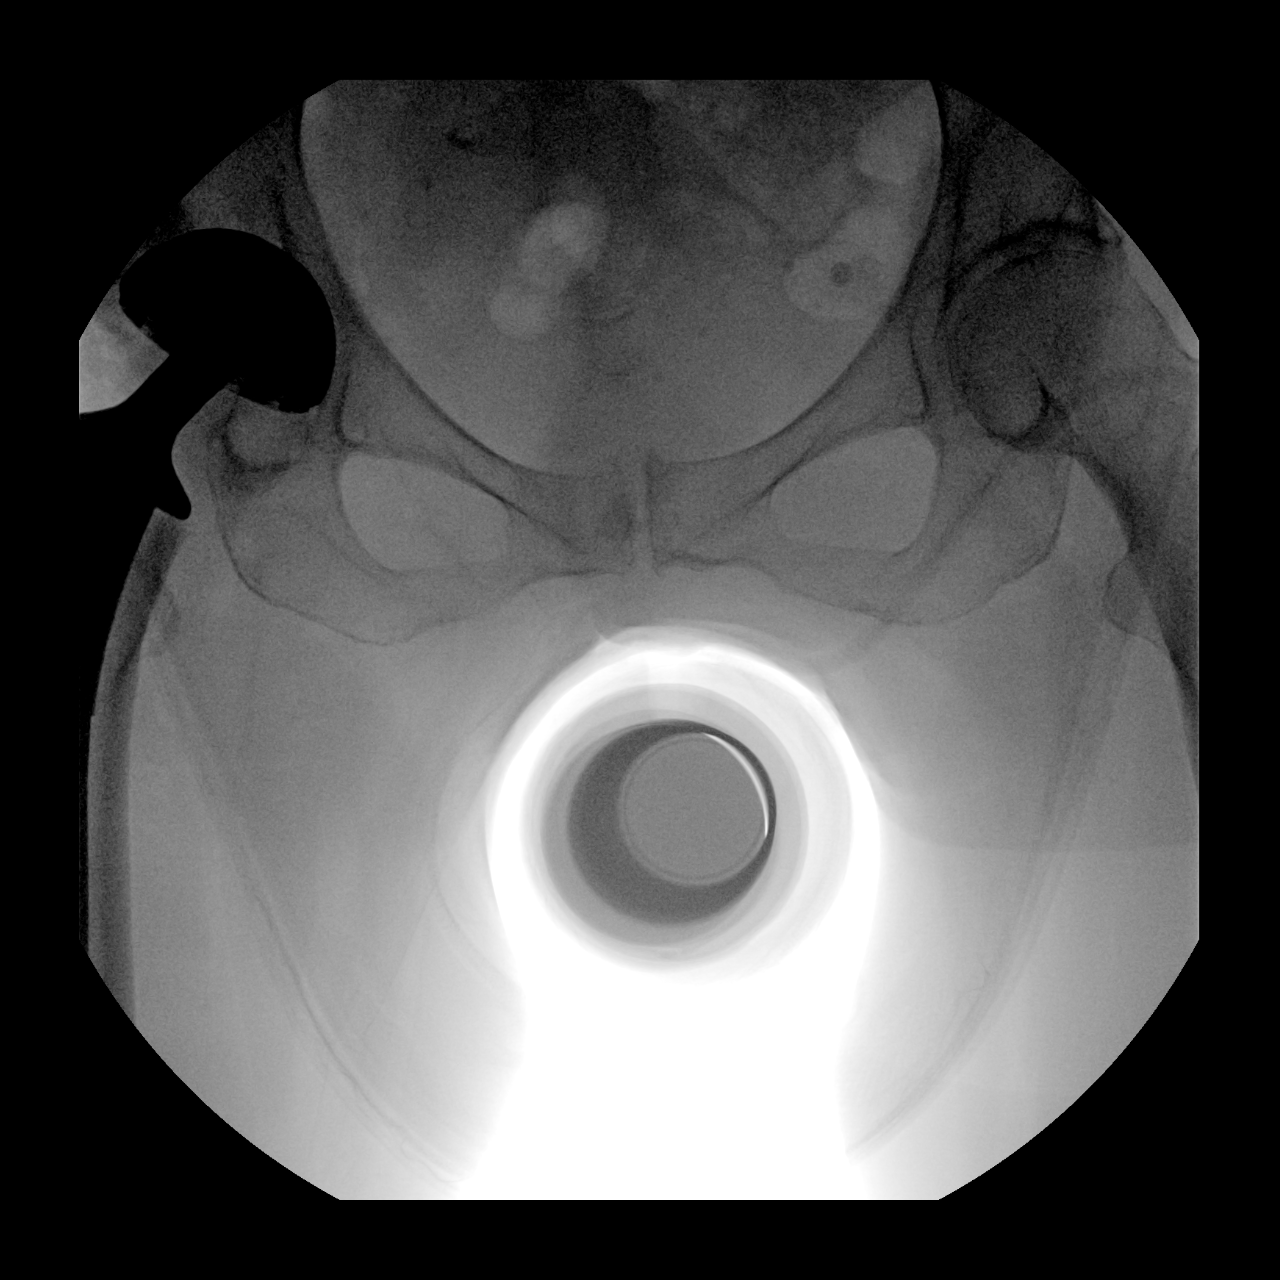

[2 of 2 positions shown; findings below may reference images not displayed]

FINDINGS: Two intraoperative fluoroscopic images demonstrate the right
acetabular and femoral components to be well situated. Expected
postoperative changes are seen in the surrounding soft tissues.
IMPRESSION: Status post right total hip arthroplasty.

## 2020-09-28 IMAGING — DX DG PORTABLE PELVIS
1 series · 1 of 1 positions shown · non-contrast
Comparison: Earlier the same day.

CLINICAL DATA: Postop right hip replacement.

EXAM:
PORTABLE PELVIS 1-2 VIEWS

[pelvis ap]
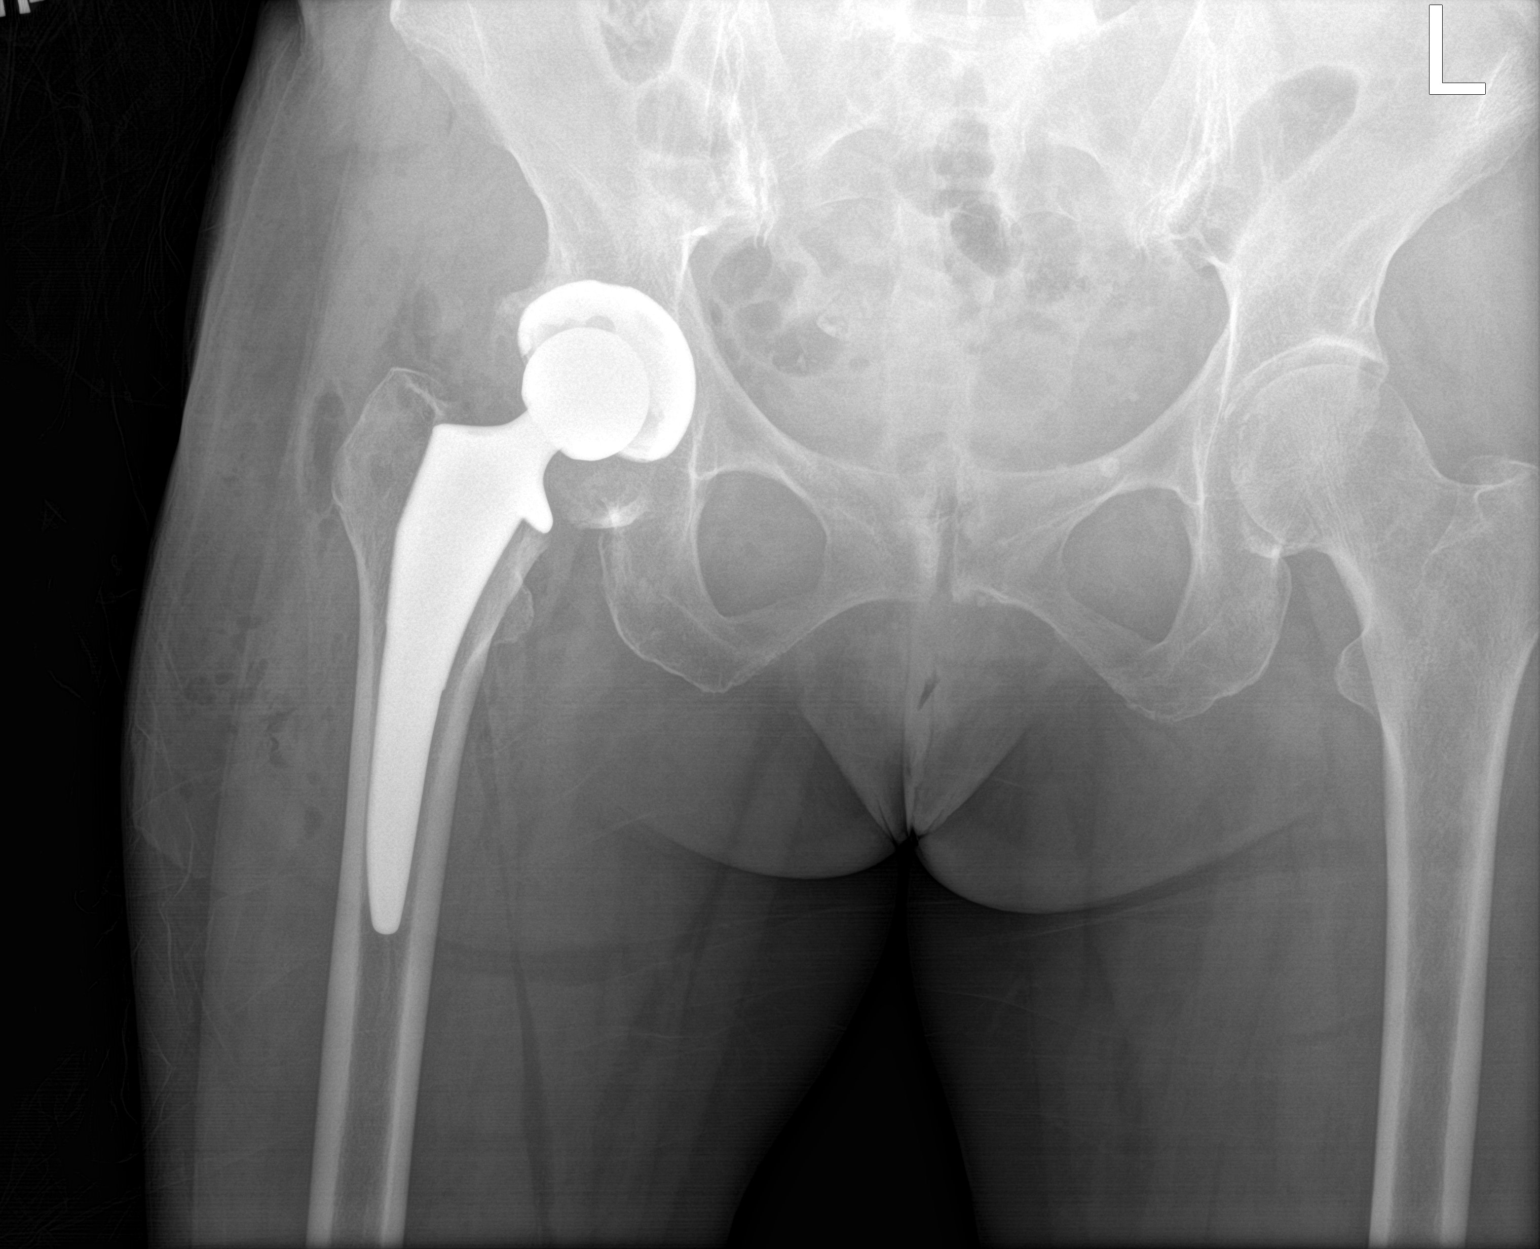

[1 of 1 positions shown; findings below may reference images not displayed]

FINDINGS: Right hip arthroplasty. Subcutaneous and joint air are noted.
Femoral stem appears well seated. Left hip is unremarkable.
IMPRESSION: Right total hip arthroplasty with expected postoperative findings.

## 2020-10-08 DIAGNOSIS — F4322 Adjustment disorder with anxiety: Secondary | ICD-10-CM | POA: Diagnosis not present

## 2020-10-22 DIAGNOSIS — F4322 Adjustment disorder with anxiety: Secondary | ICD-10-CM | POA: Diagnosis not present

## 2020-11-05 DIAGNOSIS — F4322 Adjustment disorder with anxiety: Secondary | ICD-10-CM | POA: Diagnosis not present

## 2020-11-17 DIAGNOSIS — Z87440 Personal history of urinary (tract) infections: Secondary | ICD-10-CM | POA: Diagnosis not present

## 2020-11-17 DIAGNOSIS — I10 Essential (primary) hypertension: Secondary | ICD-10-CM | POA: Diagnosis not present

## 2020-11-17 DIAGNOSIS — Z888 Allergy status to other drugs, medicaments and biological substances status: Secondary | ICD-10-CM | POA: Diagnosis not present

## 2020-11-17 DIAGNOSIS — M1990 Unspecified osteoarthritis, unspecified site: Secondary | ICD-10-CM | POA: Diagnosis not present

## 2020-11-17 DIAGNOSIS — R32 Unspecified urinary incontinence: Secondary | ICD-10-CM | POA: Diagnosis not present

## 2020-11-17 DIAGNOSIS — E039 Hypothyroidism, unspecified: Secondary | ICD-10-CM | POA: Diagnosis not present

## 2020-11-17 DIAGNOSIS — Z8249 Family history of ischemic heart disease and other diseases of the circulatory system: Secondary | ICD-10-CM | POA: Diagnosis not present

## 2020-11-17 DIAGNOSIS — Z823 Family history of stroke: Secondary | ICD-10-CM | POA: Diagnosis not present

## 2020-11-17 DIAGNOSIS — E785 Hyperlipidemia, unspecified: Secondary | ICD-10-CM | POA: Diagnosis not present

## 2020-11-19 DIAGNOSIS — F4322 Adjustment disorder with anxiety: Secondary | ICD-10-CM | POA: Diagnosis not present

## 2020-12-03 DIAGNOSIS — F4322 Adjustment disorder with anxiety: Secondary | ICD-10-CM | POA: Diagnosis not present

## 2020-12-17 DIAGNOSIS — F4322 Adjustment disorder with anxiety: Secondary | ICD-10-CM | POA: Diagnosis not present

## 2020-12-31 DIAGNOSIS — F4322 Adjustment disorder with anxiety: Secondary | ICD-10-CM | POA: Diagnosis not present

## 2021-01-14 DIAGNOSIS — F4322 Adjustment disorder with anxiety: Secondary | ICD-10-CM | POA: Diagnosis not present

## 2021-01-28 DIAGNOSIS — F4322 Adjustment disorder with anxiety: Secondary | ICD-10-CM | POA: Diagnosis not present

## 2021-02-11 DIAGNOSIS — F4322 Adjustment disorder with anxiety: Secondary | ICD-10-CM | POA: Diagnosis not present

## 2021-02-25 DIAGNOSIS — F4322 Adjustment disorder with anxiety: Secondary | ICD-10-CM | POA: Diagnosis not present

## 2021-03-11 DIAGNOSIS — F4322 Adjustment disorder with anxiety: Secondary | ICD-10-CM | POA: Diagnosis not present

## 2021-03-25 DIAGNOSIS — F4322 Adjustment disorder with anxiety: Secondary | ICD-10-CM | POA: Diagnosis not present

## 2021-04-08 DIAGNOSIS — F4322 Adjustment disorder with anxiety: Secondary | ICD-10-CM | POA: Diagnosis not present

## 2021-04-22 DIAGNOSIS — F4322 Adjustment disorder with anxiety: Secondary | ICD-10-CM | POA: Diagnosis not present

## 2021-04-23 DIAGNOSIS — Z1231 Encounter for screening mammogram for malignant neoplasm of breast: Secondary | ICD-10-CM | POA: Diagnosis not present

## 2021-05-06 DIAGNOSIS — F4322 Adjustment disorder with anxiety: Secondary | ICD-10-CM | POA: Diagnosis not present

## 2021-06-03 DIAGNOSIS — F4322 Adjustment disorder with anxiety: Secondary | ICD-10-CM | POA: Diagnosis not present

## 2021-06-04 DIAGNOSIS — Z96641 Presence of right artificial hip joint: Secondary | ICD-10-CM | POA: Diagnosis not present

## 2021-06-08 DIAGNOSIS — H0288A Meibomian gland dysfunction right eye, upper and lower eyelids: Secondary | ICD-10-CM | POA: Diagnosis not present

## 2021-06-08 DIAGNOSIS — H0288B Meibomian gland dysfunction left eye, upper and lower eyelids: Secondary | ICD-10-CM | POA: Diagnosis not present

## 2021-06-08 DIAGNOSIS — H16223 Keratoconjunctivitis sicca, not specified as Sjogren's, bilateral: Secondary | ICD-10-CM | POA: Diagnosis not present

## 2021-06-17 DIAGNOSIS — F4322 Adjustment disorder with anxiety: Secondary | ICD-10-CM | POA: Diagnosis not present

## 2021-06-25 DIAGNOSIS — B070 Plantar wart: Secondary | ICD-10-CM | POA: Diagnosis not present

## 2021-06-25 DIAGNOSIS — D2272 Melanocytic nevi of left lower limb, including hip: Secondary | ICD-10-CM | POA: Diagnosis not present

## 2021-06-25 DIAGNOSIS — L218 Other seborrheic dermatitis: Secondary | ICD-10-CM | POA: Diagnosis not present

## 2021-07-01 DIAGNOSIS — F4322 Adjustment disorder with anxiety: Secondary | ICD-10-CM | POA: Diagnosis not present

## 2021-07-07 DIAGNOSIS — E782 Mixed hyperlipidemia: Secondary | ICD-10-CM | POA: Diagnosis not present

## 2021-07-07 DIAGNOSIS — M810 Age-related osteoporosis without current pathological fracture: Secondary | ICD-10-CM | POA: Diagnosis not present

## 2021-07-07 DIAGNOSIS — Z0000 Encounter for general adult medical examination without abnormal findings: Secondary | ICD-10-CM | POA: Diagnosis not present

## 2021-07-07 DIAGNOSIS — Z79899 Other long term (current) drug therapy: Secondary | ICD-10-CM | POA: Diagnosis not present

## 2021-07-07 DIAGNOSIS — Z1331 Encounter for screening for depression: Secondary | ICD-10-CM | POA: Diagnosis not present

## 2021-07-07 DIAGNOSIS — N390 Urinary tract infection, site not specified: Secondary | ICD-10-CM | POA: Diagnosis not present

## 2021-07-07 DIAGNOSIS — I10 Essential (primary) hypertension: Secondary | ICD-10-CM | POA: Diagnosis not present

## 2021-07-07 DIAGNOSIS — E039 Hypothyroidism, unspecified: Secondary | ICD-10-CM | POA: Diagnosis not present

## 2021-07-07 DIAGNOSIS — Z6823 Body mass index (BMI) 23.0-23.9, adult: Secondary | ICD-10-CM | POA: Diagnosis not present

## 2021-07-15 DIAGNOSIS — F4322 Adjustment disorder with anxiety: Secondary | ICD-10-CM | POA: Diagnosis not present

## 2021-07-29 DIAGNOSIS — F4322 Adjustment disorder with anxiety: Secondary | ICD-10-CM | POA: Diagnosis not present

## 2021-08-12 DIAGNOSIS — F4322 Adjustment disorder with anxiety: Secondary | ICD-10-CM | POA: Diagnosis not present

## 2021-08-26 DIAGNOSIS — F4322 Adjustment disorder with anxiety: Secondary | ICD-10-CM | POA: Diagnosis not present

## 2021-09-09 DIAGNOSIS — F4322 Adjustment disorder with anxiety: Secondary | ICD-10-CM | POA: Diagnosis not present

## 2021-10-05 DIAGNOSIS — H16223 Keratoconjunctivitis sicca, not specified as Sjogren's, bilateral: Secondary | ICD-10-CM | POA: Diagnosis not present

## 2021-10-05 DIAGNOSIS — H0288A Meibomian gland dysfunction right eye, upper and lower eyelids: Secondary | ICD-10-CM | POA: Diagnosis not present

## 2021-10-05 DIAGNOSIS — H0288B Meibomian gland dysfunction left eye, upper and lower eyelids: Secondary | ICD-10-CM | POA: Diagnosis not present

## 2021-10-21 DIAGNOSIS — F4322 Adjustment disorder with anxiety: Secondary | ICD-10-CM | POA: Diagnosis not present

## 2021-11-04 DIAGNOSIS — F4322 Adjustment disorder with anxiety: Secondary | ICD-10-CM | POA: Diagnosis not present

## 2021-11-18 DIAGNOSIS — F4322 Adjustment disorder with anxiety: Secondary | ICD-10-CM | POA: Diagnosis not present

## 2021-11-29 DIAGNOSIS — E039 Hypothyroidism, unspecified: Secondary | ICD-10-CM | POA: Diagnosis not present

## 2021-11-29 DIAGNOSIS — N390 Urinary tract infection, site not specified: Secondary | ICD-10-CM | POA: Diagnosis not present

## 2021-11-29 DIAGNOSIS — L309 Dermatitis, unspecified: Secondary | ICD-10-CM | POA: Diagnosis not present

## 2021-11-29 DIAGNOSIS — M1990 Unspecified osteoarthritis, unspecified site: Secondary | ICD-10-CM | POA: Diagnosis not present

## 2021-11-29 DIAGNOSIS — I252 Old myocardial infarction: Secondary | ICD-10-CM | POA: Diagnosis not present

## 2021-11-29 DIAGNOSIS — Z809 Family history of malignant neoplasm, unspecified: Secondary | ICD-10-CM | POA: Diagnosis not present

## 2021-11-29 DIAGNOSIS — R32 Unspecified urinary incontinence: Secondary | ICD-10-CM | POA: Diagnosis not present

## 2021-11-29 DIAGNOSIS — E785 Hyperlipidemia, unspecified: Secondary | ICD-10-CM | POA: Diagnosis not present

## 2021-11-29 DIAGNOSIS — I10 Essential (primary) hypertension: Secondary | ICD-10-CM | POA: Diagnosis not present

## 2021-12-02 DIAGNOSIS — F4322 Adjustment disorder with anxiety: Secondary | ICD-10-CM | POA: Diagnosis not present

## 2021-12-16 DIAGNOSIS — F4322 Adjustment disorder with anxiety: Secondary | ICD-10-CM | POA: Diagnosis not present

## 2021-12-30 DIAGNOSIS — F4322 Adjustment disorder with anxiety: Secondary | ICD-10-CM | POA: Diagnosis not present

## 2022-01-04 DIAGNOSIS — H04123 Dry eye syndrome of bilateral lacrimal glands: Secondary | ICD-10-CM | POA: Diagnosis not present

## 2022-01-12 DIAGNOSIS — F4322 Adjustment disorder with anxiety: Secondary | ICD-10-CM | POA: Diagnosis not present

## 2022-01-27 DIAGNOSIS — F4322 Adjustment disorder with anxiety: Secondary | ICD-10-CM | POA: Diagnosis not present

## 2022-02-24 DIAGNOSIS — F4322 Adjustment disorder with anxiety: Secondary | ICD-10-CM | POA: Diagnosis not present

## 2022-03-24 DIAGNOSIS — F4322 Adjustment disorder with anxiety: Secondary | ICD-10-CM | POA: Diagnosis not present

## 2022-04-21 DIAGNOSIS — F4322 Adjustment disorder with anxiety: Secondary | ICD-10-CM | POA: Diagnosis not present

## 2022-05-19 DIAGNOSIS — F4322 Adjustment disorder with anxiety: Secondary | ICD-10-CM | POA: Diagnosis not present

## 2022-06-16 DIAGNOSIS — F4322 Adjustment disorder with anxiety: Secondary | ICD-10-CM | POA: Diagnosis not present

## 2022-06-17 DIAGNOSIS — D2272 Melanocytic nevi of left lower limb, including hip: Secondary | ICD-10-CM | POA: Diagnosis not present

## 2022-06-17 DIAGNOSIS — L218 Other seborrheic dermatitis: Secondary | ICD-10-CM | POA: Diagnosis not present

## 2022-06-17 DIAGNOSIS — L821 Other seborrheic keratosis: Secondary | ICD-10-CM | POA: Diagnosis not present

## 2022-06-17 DIAGNOSIS — L814 Other melanin hyperpigmentation: Secondary | ICD-10-CM | POA: Diagnosis not present

## 2022-06-17 DIAGNOSIS — L738 Other specified follicular disorders: Secondary | ICD-10-CM | POA: Diagnosis not present

## 2022-06-17 DIAGNOSIS — D1801 Hemangioma of skin and subcutaneous tissue: Secondary | ICD-10-CM | POA: Diagnosis not present

## 2022-06-17 DIAGNOSIS — D2371 Other benign neoplasm of skin of right lower limb, including hip: Secondary | ICD-10-CM | POA: Diagnosis not present

## 2022-06-30 DIAGNOSIS — F4322 Adjustment disorder with anxiety: Secondary | ICD-10-CM | POA: Diagnosis not present

## 2022-07-14 DIAGNOSIS — Z79899 Other long term (current) drug therapy: Secondary | ICD-10-CM | POA: Diagnosis not present

## 2022-07-14 DIAGNOSIS — E039 Hypothyroidism, unspecified: Secondary | ICD-10-CM | POA: Diagnosis not present

## 2022-07-14 DIAGNOSIS — L309 Dermatitis, unspecified: Secondary | ICD-10-CM | POA: Diagnosis not present

## 2022-07-14 DIAGNOSIS — M810 Age-related osteoporosis without current pathological fracture: Secondary | ICD-10-CM | POA: Diagnosis not present

## 2022-07-14 DIAGNOSIS — E782 Mixed hyperlipidemia: Secondary | ICD-10-CM | POA: Diagnosis not present

## 2022-07-14 DIAGNOSIS — I10 Essential (primary) hypertension: Secondary | ICD-10-CM | POA: Diagnosis not present

## 2022-07-14 DIAGNOSIS — Z23 Encounter for immunization: Secondary | ICD-10-CM | POA: Diagnosis not present

## 2022-07-14 DIAGNOSIS — Z6823 Body mass index (BMI) 23.0-23.9, adult: Secondary | ICD-10-CM | POA: Diagnosis not present

## 2022-07-14 DIAGNOSIS — Z0000 Encounter for general adult medical examination without abnormal findings: Secondary | ICD-10-CM | POA: Diagnosis not present

## 2022-07-21 DIAGNOSIS — F4322 Adjustment disorder with anxiety: Secondary | ICD-10-CM | POA: Diagnosis not present

## 2022-07-26 DIAGNOSIS — E876 Hypokalemia: Secondary | ICD-10-CM | POA: Diagnosis not present

## 2022-07-26 DIAGNOSIS — Z6822 Body mass index (BMI) 22.0-22.9, adult: Secondary | ICD-10-CM | POA: Diagnosis not present

## 2022-08-11 DIAGNOSIS — F4322 Adjustment disorder with anxiety: Secondary | ICD-10-CM | POA: Diagnosis not present

## 2022-09-08 DIAGNOSIS — F4322 Adjustment disorder with anxiety: Secondary | ICD-10-CM | POA: Diagnosis not present

## 2022-10-04 ENCOUNTER — Encounter: Payer: Self-pay | Admitting: Allergy and Immunology

## 2022-10-04 ENCOUNTER — Ambulatory Visit: Payer: Medicare PPO | Admitting: Allergy and Immunology

## 2022-10-04 VITALS — BP 118/72 | HR 64 | Resp 16 | Ht 62.0 in | Wt 130.8 lb

## 2022-10-04 DIAGNOSIS — L989 Disorder of the skin and subcutaneous tissue, unspecified: Secondary | ICD-10-CM | POA: Diagnosis not present

## 2022-10-04 NOTE — Progress Notes (Unsigned)
[place] - [place] - [place] - [place] - [place]   Dear Welton Flakes,  Thank you for referring Katelind Pytel to the [facility] [place] on 10/04/2022.   Below is a summation of this patient's evaluation and recommendations.  Thank you for your referral. I will keep you informed about this patient's response to treatment.   If you have any questions please do not hesitate to contact me.   Sincerely,  Jessica Priest, MD Allergy / Immunology [facility]   ______________________________________________________________________    NEW PATIENT NOTE  Referring Provider: Lise Auer, MD Primary Provider: Lise Auer, MD Date of office visit: 10/04/2022    Subjective:   Chief Complaint:  Maja Mccaffery (DOB: 1945-12-26) is a 77 y.o. female who presents to the clinic on 10/04/2022 with a chief complaint of Eczema .     HPI: Evamarie presents to this clinic in evaluation of dermatitis.  She has been under the care of [facility] dermatology for a scalp dermatitis has been present for 3 years.  She has been treated with Selsun Blue and ketoconazole shampoo and cortisone cream and she is not really sure that any of these result in resolution of this issue.  Maybe the ketoconazole shampoo helps her somewhat.  Her dermatitis is stinging and not itchy although she sometimes does rub this area.  She has not really noticed an obvious provoking factor giving rise to this issue as she has not really had a significant change in her environments or in medication use or supplement preceding to the development of this dermatitis.  She has a very mild atopic history with mild spring and fall hayfever for which she will intermittently use an antihistamine.  Otherwise, she has no significant organ system involved with atopic disease.  Past Medical History:  Diagnosis Date   Arthritis    Eczema    Frequent UTI    HLD  (hyperlipidemia)    HTN (hypertension)    Hypothyroidism     Past Surgical History:  Procedure Laterality Date   FINGER GANGLION CYST EXCISION  2011   FRACTURE SURGERY     lt ankle   MASS EXCISION  07/12/2012   Procedure: EXCISION MASS;  Surgeon: Nicki Reaper, MD;  Location: [facility];  Service: Orthopedics;  Laterality: Right;  Excision Cyst Right Index and Right Ring Finger and Debridement DIP Right Index Finger and Right Ring Finger   TONSILLECTOMY     TOTAL HIP ARTHROPLASTY Right 02/20/2020   Procedure: TOTAL HIP ARTHROPLASTY ANTERIOR APPROACH;  Surgeon: Ollen Gross, MD;  Location: [facility];  Service: Orthopedics;  Laterality: Right;    TUBAL LIGATION     UTERINE FIBROID SURGERY  2012    Allergies as of 10/04/2022       Reactions   Compazine    Uncontrolled muscle contractions        Medication List    clobetasol 0.05 % topical foam Commonly known as: OLUX Apply topically.   cycloSPORINE 0.05 % ophthalmic emulsion Commonly known as: RESTASIS Place 1 drop into both eyes 2 (two) times daily.   ketoconazole 2 % shampoo Commonly known as: NIZORAL SMARTSIG:1 Sparingly Topical Twice a Week   levothyroxine 50 MCG tablet Commonly known as: SYNTHROID Take 50 mcg by mouth daily.   loperamide 2 MG tablet Commonly known as: IMODIUM A-D Take 4 mg by mouth 4 (four) times daily as needed for  diarrhea or loose stools.   losartan 25 MG tablet Commonly known as: COZAAR Take 25 mg by mouth every evening.   mometasone 0.1 % cream Commonly known as: ELOCON Apply topically.   rosuvastatin 10 MG tablet Commonly known as: CRESTOR Take 10 mg by mouth every evening.   Xiidra 5 % Soln Generic drug: Lifitegrast    Review of systems negative except as noted in HPI / PMHx or noted below:  Review of Systems  Constitutional: Negative.   HENT: Negative.    Eyes: Negative.   Respiratory: Negative.    Cardiovascular: Negative.   Gastrointestinal:  Negative.   Genitourinary: Negative.   Musculoskeletal: Negative.   Skin: Negative.   Neurological: Negative.   Endo/Heme/Allergies: Negative.   Psychiatric/Behavioral: Negative.      Family History  Problem Relation Age of Onset   Heart attack Other    Allergic rhinitis Neg Hx    Angioedema Neg Hx    Asthma Neg Hx    Atopy Neg Hx    Eczema Neg Hx    Immunodeficiency Neg Hx    Urticaria Neg Hx     Social History   Socioeconomic History   Marital status: Married    Spouse name: Not on file   Number of children: Not on file   Years of education: Not on file   Highest education level: Not on file  Occupational History   Not on file  Tobacco Use   Smoking status: Never   Smokeless tobacco: Never  Vaping Use   Vaping Use: Never used  Substance and Sexual Activity   Alcohol use: Yes    Comment: occas.   Drug use: No   Sexual activity: Not on file  Other Topics Concern   Not on file  Social History Narrative   Not on file   Social Determinants of Health   Financial Resource Strain: Not on file  Food Insecurity: Not on file  Transportation Needs: Not on file  Physical Activity: Not on file  Stress: Not on file  Social Connections: Not on file  Intimate Partner Violence: Not on file    Environmental and Social history  Lives in a house with a dry environment, a dog located inside the household, no carpeting in the bedroom, no plastic on the bed, no plastic on the pillow, no smoking ongoing with inside the household.  Objective:   Vitals:   10/04/22 1408  BP: 118/72  Pulse: 64  Resp: 16  SpO2: 95%   Height: 5\' 2"  (157.5 cm) Weight: 130 lb 12.8 oz (59.3 kg)  Physical Exam Constitutional:      Appearance: She is not diaphoretic.  HENT:     Head: Normocephalic.     Right Ear: Tympanic membrane, ear canal and external ear normal.     Left Ear: Tympanic membrane, ear canal and external ear normal.     Nose: Nose normal. No mucosal edema or rhinorrhea.      Mouth/Throat:     Pharynx: Uvula midline. No oropharyngeal exudate.  Eyes:     Conjunctiva/sclera: Conjunctivae normal.  Neck:     Thyroid: No thyromegaly.     Trachea: Trachea normal. No tracheal tenderness or tracheal deviation.  Cardiovascular:     Rate and Rhythm: Normal rate and regular rhythm.     Heart sounds: Normal heart sounds, S1 normal and S2 normal. No murmur heard. Pulmonary:     Effort: No respiratory distress.     Breath sounds: Normal breath sounds.  No stridor. No wheezing or rales.  Lymphadenopathy:     Head:     Right side of head: No tonsillar adenopathy.     Left side of head: No tonsillar adenopathy.     Cervical: No cervical adenopathy.  Skin:    Findings: Rash (Hyperpigmented lichenified slightly scaly approximately 2 cm diameter lesions affecting forehead at scalp line) present. No erythema.     Nails: There is no clubbing.  Neurological:     Mental Status: She is alert.     Diagnostics: Allergy skin tests were performed.     Assessment and Plan:    No diagnosis found.  There are no Patient Instructions on file for this visit.   Jiles Prows, MD Allergy / Immunology [facility] [place]

## 2022-10-04 NOTE — Patient Instructions (Signed)
  1.  Apply sample of Ozulera 1 time a day after shower while still wet.   2. Return next week to have patch testing placed on back.

## 2022-10-05 ENCOUNTER — Telehealth: Payer: Self-pay

## 2022-10-05 ENCOUNTER — Encounter: Payer: Self-pay | Admitting: Allergy and Immunology

## 2022-10-05 NOTE — Patient Outreach (Signed)
  Care Coordination   Initial Visit Note   10/05/2022 Name: Julia Hess MRN: [id number] DOB: 1945-12-03  Julia Hess is a 77 y.o. year old female who sees Mateo Flow, MD for primary care. I spoke with  Kathlen Mody by phone today.  What matters to the patients health and wellness today?  Placed call to patient to review and explain [facility] care coordination program.  Patient reports she is doing well. She states she is concerned about her bone health. Reports she has had 2 infusions for osteoporosis and then stopped. Reports she was diagnosed with osteoporosis then downgraded to osteopenia and now back to osteoporosis.  History of 2 fractured ankles and hip replacement.     Goals Addressed               This Visit's Progress     COMPLETED: I am worried about my bone health (pt-stated)        Care Coordination Interventions: Provided verbal education re: potential causes of falls and Fall prevention strategies. Reviewed tripping hazards, throw rugs, pets  Advised patient to discuss  her concern about her bone health with MD and follow recommendations. Suggested virtual visit to avoid sick contacts at MD office Reviewed importance of exercise, healthy diet and safety.         SDOH assessments and interventions completed:  No     Care Coordination Interventions:  Yes, provided   Follow up plan: No further intervention required.   Encounter Outcome:  Pt. Visit Completed   Tomasa Rand, RN, BSN, CEN [place] Coordinator [phone]

## 2022-10-05 NOTE — Addendum Note (Signed)
Addended by: Guy Franco on: 10/05/2022 05:22 PM   Modules accepted: Orders

## 2022-10-06 DIAGNOSIS — F4322 Adjustment disorder with anxiety: Secondary | ICD-10-CM | POA: Diagnosis not present

## 2022-10-11 ENCOUNTER — Ambulatory Visit (INDEPENDENT_AMBULATORY_CARE_PROVIDER_SITE_OTHER): Payer: Medicare PPO | Admitting: Allergy and Immunology

## 2022-10-11 DIAGNOSIS — L253 Unspecified contact dermatitis due to other chemical products: Secondary | ICD-10-CM | POA: Diagnosis not present

## 2022-10-11 MED ORDER — OPZELURA 1.5 % EX CREA: 1.0000 | TOPICAL_CREAM | Freq: Every day | CUTANEOUS | 3 refills | Status: AC

## 2022-10-12 ENCOUNTER — Telehealth: Payer: Self-pay

## 2022-10-12 NOTE — Telephone Encounter (Signed)
PA request received via CMM for Opzelura 1.5% cream  PA has been submitted to [facility] and is pending determination.  Key: [id number]

## 2022-10-12 NOTE — Progress Notes (Signed)
True patch test was applied to back and Julia Hess will return to this clinic in 48 hours for initial read.

## 2022-10-13 ENCOUNTER — Ambulatory Visit: Payer: Medicare PPO | Admitting: Allergy and Immunology

## 2022-10-13 DIAGNOSIS — L253 Unspecified contact dermatitis due to other chemical products: Secondary | ICD-10-CM

## 2022-10-14 NOTE — Progress Notes (Signed)
Julia Hess returns for a 48-hour patch test read.  She did not demonstrate any hypersensitivity to patch test allergens.  She will return for a 7-day reviewed.

## 2022-10-14 NOTE — Telephone Encounter (Signed)
Please advise 

## 2022-10-14 NOTE — Telephone Encounter (Signed)
Patient Advocate Encounter  Received a fax from [facility] regarding Prior Authorization for [place] 1.5% cream.   Authorization has been DENIED due to We cover this drug when our criteria is met. The unmet criteria are: has tried or cannot use a topical calcineurin inhibitor (for example: tacrolimus).

## 2022-10-18 ENCOUNTER — Ambulatory Visit: Payer: Medicare PPO | Admitting: Allergy and Immunology

## 2022-10-18 DIAGNOSIS — L253 Unspecified contact dermatitis due to other chemical products: Secondary | ICD-10-CM

## 2022-10-19 ENCOUNTER — Encounter: Payer: Self-pay | Admitting: Allergy and Immunology

## 2022-10-19 NOTE — Progress Notes (Signed)
Julia Hess returns for a 7-day patch test read.  There was no evidence of any type of reaction on her back at placement of patch testing in 7 days.

## 2022-11-03 DIAGNOSIS — F4322 Adjustment disorder with anxiety: Secondary | ICD-10-CM | POA: Diagnosis not present

## 2022-11-18 ENCOUNTER — Encounter: Payer: Self-pay | Admitting: Allergy and Immunology

## 2022-11-18 ENCOUNTER — Ambulatory Visit: Payer: Medicare PPO | Admitting: Allergy and Immunology

## 2022-11-18 VITALS — BP 118/72 | HR 85 | Resp 16

## 2022-11-18 DIAGNOSIS — L989 Disorder of the skin and subcutaneous tissue, unspecified: Secondary | ICD-10-CM

## 2022-11-18 NOTE — Progress Notes (Signed)
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  She has had an excellent response and is 95% better.  I asked her to use her Opzelura for a full 12 weeks and then see where she is at at that point in time and we can make a decision about how to proceed pending her response.

## 2022-12-01 DIAGNOSIS — F4322 Adjustment disorder with anxiety: Secondary | ICD-10-CM | POA: Diagnosis not present

## 2022-12-29 DIAGNOSIS — F4322 Adjustment disorder with anxiety: Secondary | ICD-10-CM | POA: Diagnosis not present

## 2023-01-19 DIAGNOSIS — H04123 Dry eye syndrome of bilateral lacrimal glands: Secondary | ICD-10-CM | POA: Diagnosis not present

## 2023-01-26 DIAGNOSIS — F4322 Adjustment disorder with anxiety: Secondary | ICD-10-CM | POA: Diagnosis not present

## 2023-02-23 DIAGNOSIS — F4322 Adjustment disorder with anxiety: Secondary | ICD-10-CM | POA: Diagnosis not present

## 2023-03-23 DIAGNOSIS — F4322 Adjustment disorder with anxiety: Secondary | ICD-10-CM | POA: Diagnosis not present

## 2023-05-19 DIAGNOSIS — F4322 Adjustment disorder with anxiety: Secondary | ICD-10-CM | POA: Diagnosis not present

## 2023-06-15 DIAGNOSIS — F4322 Adjustment disorder with anxiety: Secondary | ICD-10-CM | POA: Diagnosis not present

## 2023-07-26 DIAGNOSIS — H6121 Impacted cerumen, right ear: Secondary | ICD-10-CM | POA: Diagnosis not present

## 2023-07-26 DIAGNOSIS — E039 Hypothyroidism, unspecified: Secondary | ICD-10-CM | POA: Diagnosis not present

## 2023-07-26 DIAGNOSIS — Z79899 Other long term (current) drug therapy: Secondary | ICD-10-CM | POA: Diagnosis not present

## 2023-07-26 DIAGNOSIS — I10 Essential (primary) hypertension: Secondary | ICD-10-CM | POA: Diagnosis not present

## 2023-07-26 DIAGNOSIS — Z0000 Encounter for general adult medical examination without abnormal findings: Secondary | ICD-10-CM | POA: Diagnosis not present

## 2023-07-26 DIAGNOSIS — M810 Age-related osteoporosis without current pathological fracture: Secondary | ICD-10-CM | POA: Diagnosis not present

## 2023-07-26 DIAGNOSIS — Z9181 History of falling: Secondary | ICD-10-CM | POA: Diagnosis not present

## 2023-07-26 DIAGNOSIS — E782 Mixed hyperlipidemia: Secondary | ICD-10-CM | POA: Diagnosis not present

## 2023-07-26 DIAGNOSIS — Z1339 Encounter for screening examination for other mental health and behavioral disorders: Secondary | ICD-10-CM | POA: Diagnosis not present

## 2023-08-10 DIAGNOSIS — F4322 Adjustment disorder with anxiety: Secondary | ICD-10-CM | POA: Diagnosis not present

## 2023-09-12 ENCOUNTER — Encounter: Payer: Self-pay | Admitting: *Deleted

## 2023-09-12 NOTE — Progress Notes (Signed)
This encounter was created in error - please disregard.

## 2023-09-15 DIAGNOSIS — Z961 Presence of intraocular lens: Secondary | ICD-10-CM | POA: Diagnosis not present

## 2023-09-15 DIAGNOSIS — H43813 Vitreous degeneration, bilateral: Secondary | ICD-10-CM | POA: Diagnosis not present

## 2023-09-15 DIAGNOSIS — H16223 Keratoconjunctivitis sicca, not specified as Sjogren's, bilateral: Secondary | ICD-10-CM | POA: Diagnosis not present

## 2024-02-27 DIAGNOSIS — Z6823 Body mass index (BMI) 23.0-23.9, adult: Secondary | ICD-10-CM | POA: Diagnosis not present

## 2024-02-27 DIAGNOSIS — M25572 Pain in left ankle and joints of left foot: Secondary | ICD-10-CM | POA: Diagnosis not present

## 2024-02-27 DIAGNOSIS — H6121 Impacted cerumen, right ear: Secondary | ICD-10-CM | POA: Diagnosis not present

## 2024-02-29 DIAGNOSIS — S92355A Nondisplaced fracture of fifth metatarsal bone, left foot, initial encounter for closed fracture: Secondary | ICD-10-CM | POA: Diagnosis not present

## 2024-03-04 DIAGNOSIS — M79672 Pain in left foot: Secondary | ICD-10-CM | POA: Diagnosis not present

## 2024-03-08 DIAGNOSIS — S92355A Nondisplaced fracture of fifth metatarsal bone, left foot, initial encounter for closed fracture: Secondary | ICD-10-CM | POA: Diagnosis not present

## 2024-04-09 DIAGNOSIS — S92355D Nondisplaced fracture of fifth metatarsal bone, left foot, subsequent encounter for fracture with routine healing: Secondary | ICD-10-CM | POA: Diagnosis not present

## 2024-05-07 DIAGNOSIS — S92353D Displaced fracture of fifth metatarsal bone, unspecified foot, subsequent encounter for fracture with routine healing: Secondary | ICD-10-CM | POA: Diagnosis not present

## 2024-05-07 DIAGNOSIS — M79672 Pain in left foot: Secondary | ICD-10-CM | POA: Diagnosis not present

## 2024-05-17 DIAGNOSIS — S92353D Displaced fracture of fifth metatarsal bone, unspecified foot, subsequent encounter for fracture with routine healing: Secondary | ICD-10-CM | POA: Diagnosis not present

## 2024-05-17 DIAGNOSIS — M79672 Pain in left foot: Secondary | ICD-10-CM | POA: Diagnosis not present

## 2024-05-31 DIAGNOSIS — R2689 Other abnormalities of gait and mobility: Secondary | ICD-10-CM | POA: Diagnosis not present

## 2024-05-31 DIAGNOSIS — M25572 Pain in left ankle and joints of left foot: Secondary | ICD-10-CM | POA: Diagnosis not present

## 2024-05-31 DIAGNOSIS — M25675 Stiffness of left foot, not elsewhere classified: Secondary | ICD-10-CM | POA: Diagnosis not present

## 2024-06-04 DIAGNOSIS — Z23 Encounter for immunization: Secondary | ICD-10-CM | POA: Diagnosis not present

## 2024-06-04 DIAGNOSIS — E782 Mixed hyperlipidemia: Secondary | ICD-10-CM | POA: Diagnosis not present

## 2024-06-04 DIAGNOSIS — E039 Hypothyroidism, unspecified: Secondary | ICD-10-CM | POA: Diagnosis not present

## 2024-06-04 DIAGNOSIS — R0609 Other forms of dyspnea: Secondary | ICD-10-CM | POA: Diagnosis not present

## 2024-06-04 DIAGNOSIS — I10 Essential (primary) hypertension: Secondary | ICD-10-CM | POA: Diagnosis not present

## 2024-06-04 DIAGNOSIS — M810 Age-related osteoporosis without current pathological fracture: Secondary | ICD-10-CM | POA: Diagnosis not present

## 2024-06-04 DIAGNOSIS — Z6823 Body mass index (BMI) 23.0-23.9, adult: Secondary | ICD-10-CM | POA: Diagnosis not present

## 2024-06-05 DIAGNOSIS — H04123 Dry eye syndrome of bilateral lacrimal glands: Secondary | ICD-10-CM | POA: Diagnosis not present

## 2024-06-06 DIAGNOSIS — R2689 Other abnormalities of gait and mobility: Secondary | ICD-10-CM | POA: Diagnosis not present

## 2024-06-06 DIAGNOSIS — M25675 Stiffness of left foot, not elsewhere classified: Secondary | ICD-10-CM | POA: Diagnosis not present

## 2024-06-06 DIAGNOSIS — M25572 Pain in left ankle and joints of left foot: Secondary | ICD-10-CM | POA: Diagnosis not present

## 2024-06-08 DIAGNOSIS — M25572 Pain in left ankle and joints of left foot: Secondary | ICD-10-CM | POA: Diagnosis not present

## 2024-06-08 DIAGNOSIS — M25675 Stiffness of left foot, not elsewhere classified: Secondary | ICD-10-CM | POA: Diagnosis not present

## 2024-06-08 DIAGNOSIS — R2689 Other abnormalities of gait and mobility: Secondary | ICD-10-CM | POA: Diagnosis not present

## 2024-06-12 DIAGNOSIS — R2689 Other abnormalities of gait and mobility: Secondary | ICD-10-CM | POA: Diagnosis not present

## 2024-06-12 DIAGNOSIS — M25572 Pain in left ankle and joints of left foot: Secondary | ICD-10-CM | POA: Diagnosis not present

## 2024-06-12 DIAGNOSIS — M25675 Stiffness of left foot, not elsewhere classified: Secondary | ICD-10-CM | POA: Diagnosis not present

## 2024-06-13 DIAGNOSIS — S92355D Nondisplaced fracture of fifth metatarsal bone, left foot, subsequent encounter for fracture with routine healing: Secondary | ICD-10-CM | POA: Diagnosis not present

## 2024-06-14 DIAGNOSIS — M25572 Pain in left ankle and joints of left foot: Secondary | ICD-10-CM | POA: Diagnosis not present

## 2024-06-14 DIAGNOSIS — R2689 Other abnormalities of gait and mobility: Secondary | ICD-10-CM | POA: Diagnosis not present

## 2024-06-14 DIAGNOSIS — M25675 Stiffness of left foot, not elsewhere classified: Secondary | ICD-10-CM | POA: Diagnosis not present

## 2024-06-19 DIAGNOSIS — M25572 Pain in left ankle and joints of left foot: Secondary | ICD-10-CM | POA: Diagnosis not present

## 2024-06-19 DIAGNOSIS — R2689 Other abnormalities of gait and mobility: Secondary | ICD-10-CM | POA: Diagnosis not present

## 2024-06-19 DIAGNOSIS — M25675 Stiffness of left foot, not elsewhere classified: Secondary | ICD-10-CM | POA: Diagnosis not present

## 2024-06-25 DIAGNOSIS — R2689 Other abnormalities of gait and mobility: Secondary | ICD-10-CM | POA: Diagnosis not present

## 2024-06-25 DIAGNOSIS — M25675 Stiffness of left foot, not elsewhere classified: Secondary | ICD-10-CM | POA: Diagnosis not present

## 2024-06-25 DIAGNOSIS — M25572 Pain in left ankle and joints of left foot: Secondary | ICD-10-CM | POA: Diagnosis not present

## 2024-06-28 DIAGNOSIS — M25675 Stiffness of left foot, not elsewhere classified: Secondary | ICD-10-CM | POA: Diagnosis not present

## 2024-06-28 DIAGNOSIS — R2689 Other abnormalities of gait and mobility: Secondary | ICD-10-CM | POA: Diagnosis not present

## 2024-06-28 DIAGNOSIS — M25572 Pain in left ankle and joints of left foot: Secondary | ICD-10-CM | POA: Diagnosis not present

## 2024-07-02 DIAGNOSIS — M25572 Pain in left ankle and joints of left foot: Secondary | ICD-10-CM | POA: Diagnosis not present

## 2024-07-02 DIAGNOSIS — R2689 Other abnormalities of gait and mobility: Secondary | ICD-10-CM | POA: Diagnosis not present

## 2024-07-02 DIAGNOSIS — M25675 Stiffness of left foot, not elsewhere classified: Secondary | ICD-10-CM | POA: Diagnosis not present

## 2024-07-05 DIAGNOSIS — M25675 Stiffness of left foot, not elsewhere classified: Secondary | ICD-10-CM | POA: Diagnosis not present

## 2024-07-05 DIAGNOSIS — R2689 Other abnormalities of gait and mobility: Secondary | ICD-10-CM | POA: Diagnosis not present

## 2024-07-05 DIAGNOSIS — M25572 Pain in left ankle and joints of left foot: Secondary | ICD-10-CM | POA: Diagnosis not present

## 2024-07-12 DIAGNOSIS — M5489 Other dorsalgia: Secondary | ICD-10-CM | POA: Diagnosis not present

## 2024-07-12 DIAGNOSIS — R2689 Other abnormalities of gait and mobility: Secondary | ICD-10-CM | POA: Diagnosis not present

## 2024-07-12 DIAGNOSIS — M2560 Stiffness of unspecified joint, not elsewhere classified: Secondary | ICD-10-CM | POA: Diagnosis not present

## 2024-07-19 DIAGNOSIS — M5489 Other dorsalgia: Secondary | ICD-10-CM | POA: Diagnosis not present

## 2024-07-19 DIAGNOSIS — M2560 Stiffness of unspecified joint, not elsewhere classified: Secondary | ICD-10-CM | POA: Diagnosis not present

## 2024-07-19 DIAGNOSIS — R2689 Other abnormalities of gait and mobility: Secondary | ICD-10-CM | POA: Diagnosis not present

## 2024-07-30 DIAGNOSIS — Z1231 Encounter for screening mammogram for malignant neoplasm of breast: Secondary | ICD-10-CM | POA: Diagnosis not present

## 2024-07-30 DIAGNOSIS — M810 Age-related osteoporosis without current pathological fracture: Secondary | ICD-10-CM | POA: Diagnosis not present

## 2024-08-02 DIAGNOSIS — M5489 Other dorsalgia: Secondary | ICD-10-CM | POA: Diagnosis not present

## 2024-08-02 DIAGNOSIS — M2560 Stiffness of unspecified joint, not elsewhere classified: Secondary | ICD-10-CM | POA: Diagnosis not present

## 2024-08-02 DIAGNOSIS — R2689 Other abnormalities of gait and mobility: Secondary | ICD-10-CM | POA: Diagnosis not present

## 2024-08-09 DIAGNOSIS — R2689 Other abnormalities of gait and mobility: Secondary | ICD-10-CM | POA: Diagnosis not present

## 2024-08-09 DIAGNOSIS — M2560 Stiffness of unspecified joint, not elsewhere classified: Secondary | ICD-10-CM | POA: Diagnosis not present

## 2024-08-09 DIAGNOSIS — M5489 Other dorsalgia: Secondary | ICD-10-CM | POA: Diagnosis not present

## 2024-08-30 DIAGNOSIS — M2560 Stiffness of unspecified joint, not elsewhere classified: Secondary | ICD-10-CM | POA: Diagnosis not present

## 2024-08-30 DIAGNOSIS — R2689 Other abnormalities of gait and mobility: Secondary | ICD-10-CM | POA: Diagnosis not present

## 2024-08-30 DIAGNOSIS — M5489 Other dorsalgia: Secondary | ICD-10-CM | POA: Diagnosis not present

## 2024-09-05 DIAGNOSIS — Z1231 Encounter for screening mammogram for malignant neoplasm of breast: Secondary | ICD-10-CM | POA: Diagnosis not present
